# Patient Record
Sex: Male | Born: 1975
Health system: Southern US, Community
[De-identification: ages and names within clinical notes are randomized; demographics above are authoritative.]

## PROBLEM LIST (undated history)

## (undated) DIAGNOSIS — M25519 Pain in unspecified shoulder: Secondary | ICD-10-CM

## (undated) DIAGNOSIS — T7840XA Allergy, unspecified, initial encounter: Secondary | ICD-10-CM

## (undated) DIAGNOSIS — G4700 Insomnia, unspecified: Secondary | ICD-10-CM

## (undated) DIAGNOSIS — S060X9A Concussion with loss of consciousness of unspecified duration, initial encounter: Secondary | ICD-10-CM

## (undated) DIAGNOSIS — F419 Anxiety disorder, unspecified: Secondary | ICD-10-CM

## (undated) DIAGNOSIS — E559 Vitamin D deficiency, unspecified: Secondary | ICD-10-CM

## (undated) DIAGNOSIS — S060XAA Concussion with loss of consciousness status unknown, initial encounter: Secondary | ICD-10-CM

## (undated) DIAGNOSIS — B019 Varicella without complication: Secondary | ICD-10-CM

## (undated) HISTORY — DX: Vitamin D deficiency, unspecified: E55.9

## (undated) HISTORY — DX: Allergy, unspecified, initial encounter: T78.40XA

## (undated) HISTORY — DX: Concussion with loss of consciousness of unspecified duration, initial encounter: S06.0X9A

## (undated) HISTORY — DX: Anxiety disorder, unspecified: F41.9

## (undated) HISTORY — DX: Concussion with loss of consciousness status unknown, initial encounter: S06.0XAA

## (undated) HISTORY — DX: Varicella without complication: B01.9

## (undated) HISTORY — DX: Pain in unspecified shoulder: M25.519

## (undated) HISTORY — DX: Insomnia, unspecified: G47.00

---

## 2012-05-22 HISTORY — PX: VASECTOMY: SHX75

## 2013-05-22 HISTORY — PX: ROTATOR CUFF REPAIR: SHX139

## 2014-01-21 ENCOUNTER — Ambulatory Visit: Payer: Self-pay | Admitting: General Practice

## 2014-10-02 ENCOUNTER — Other Ambulatory Visit: Payer: Self-pay | Admitting: *Deleted

## 2014-10-02 ENCOUNTER — Ambulatory Visit (INDEPENDENT_AMBULATORY_CARE_PROVIDER_SITE_OTHER): Payer: 59 | Admitting: Internal Medicine

## 2014-10-02 DIAGNOSIS — Z23 Encounter for immunization: Secondary | ICD-10-CM | POA: Diagnosis not present

## 2014-10-02 DIAGNOSIS — Z9189 Other specified personal risk factors, not elsewhere classified: Secondary | ICD-10-CM

## 2014-10-02 MED ORDER — CIPROFLOXACIN HCL 500 MG PO TABS: 500.0000 mg | ORAL_TABLET | Freq: Two times a day (BID) | ORAL | Status: DC

## 2014-10-02 MED ORDER — TYPHOID VACCINE PO CPDR: 1.0000 | DELAYED_RELEASE_CAPSULE | ORAL | Status: DC

## 2014-10-02 NOTE — Progress Notes (Signed)
   Subjective:    Patient ID: Kevin ListerJoshua T [name], male    DOB: 01/18/1976, 39 y.o.   MRN: [id number]  HPI 39 yo M in good state of health going [place] [facility] 2 [place] as part of family vacation while his wife is there for work for [employer]. He has not had flu or tdap that he can recal  Prior travel = [place] and [place]  No Known Allergies    Review of Systems     Objective:   Physical Exam        Assessment & Plan:  Pre travel counseling  Pre travel vaccination = recommend hepatitis A, Tdap and will give oral typhoid vaccine  Traveler's diarrhea = gave rx for cipro to use if needed  Gave instruction for mosquite bite prevention

## 2015-07-27 ENCOUNTER — Ambulatory Visit: Payer: 59 | Admitting: Family Medicine

## 2015-08-03 ENCOUNTER — Encounter: Payer: Self-pay | Admitting: Family Medicine

## 2015-08-03 ENCOUNTER — Ambulatory Visit (INDEPENDENT_AMBULATORY_CARE_PROVIDER_SITE_OTHER): Payer: 59 | Admitting: Family Medicine

## 2015-08-03 VITALS — BP 122/88 | HR 65 | Temp 98.7°F | Ht 70.5 in | Wt 204.5 lb

## 2015-08-03 DIAGNOSIS — R6882 Decreased libido: Secondary | ICD-10-CM | POA: Insufficient documentation

## 2015-08-03 DIAGNOSIS — Z0000 Encounter for general adult medical examination without abnormal findings: Secondary | ICD-10-CM

## 2015-08-03 DIAGNOSIS — G4700 Insomnia, unspecified: Secondary | ICD-10-CM | POA: Diagnosis not present

## 2015-08-03 DIAGNOSIS — E782 Mixed hyperlipidemia: Secondary | ICD-10-CM | POA: Insufficient documentation

## 2015-08-03 DIAGNOSIS — E785 Hyperlipidemia, unspecified: Secondary | ICD-10-CM | POA: Insufficient documentation

## 2015-08-03 NOTE — Progress Notes (Signed)
v  Subjective:  Patient ID: Kevin [name], male    DOB: 06-02-1975  Age: 40 y.o. MRN: [id number]  CC: Establish care  HPI Kevin [name] is a 40 y.o. male presents to the clinic today to establish care. Additional concerns are below.  Preventative Healthcare  Colonoscopy: N/A.  Immunizations  Tetanus - up-to-date. Was given last year.  Pneumococcal - not indicated.  Flu - declined.  Zoster - not indicated.  Prostate cancer screening: Not indicated at this time given lack of family history.  Hepatitis C screening - not within screening guidelines.  Labs: Has had recent laboratory studies and 2016.  Exercise: Exercises regularly.  Alcohol use: Approximately 10 beers a week.  Smoking/tobacco use: No.  STD/HIV testing: Happily married. Not indicated time.  Regular dental exams: Yes.  Wears seat belt: Yes.  Decreased libido  Patient reports that he's had decreased libido for the past year or so.  Seems to have occurred after he took Propecia for hair loss.  No reported issues with her dysfunction.  No other associated symptoms.  No known exacerbating or relieving factors.  Insomnia  Patient reports that he's had insomnia for the past 2-3 years.  He states that it occurs approximately 2-3 times a week.  He states that he has trouble staying asleep as he awakens at approximately 1 to 2:30 in the morning. Subsequent has difficulty falling back asleep.  He denies having any difficulty falling asleep initially.  He has been taking melatonin with some improvement.  This may be exacerbated by stress.  PMH, Surgical Hx, Family Hx, Social History reviewed and updated as below.  Past Medical History  Diagnosis Date  . Allergy   . Chicken pox    Past Surgical History  Procedure Laterality Date  . Rotator cuff repair  2015  . Vasectomy  2014   Family History  Problem Relation Age of Onset  . Hypertension Mother   . Hyperlipidemia Father   .  Hyperlipidemia Maternal Grandmother   . Hypertension Maternal Grandmother   . Arthritis Maternal Grandmother    Social History  Substance Use Topics  . Smoking status: Never Smoker   . Smokeless tobacco: Never Used  . Alcohol Use: 6.0 oz/week    10 Cans of beer per week   Review of Systems  Genitourinary:       Decreased libido.  Psychiatric/Behavioral:       Insomnia.  All other systems reviewed and are negative.  Objective:   Today's Vitals: BP 122/88 mmHg  Pulse 65  Temp(Src) 98.7 F (37.1 C) (Oral)  Ht 5' 10.5" (1.791 m)  Wt 204 lb 8 oz (92.761 kg)  BMI 28.92 kg/m2  SpO2 96%  Physical Exam  Constitutional: He is oriented to person, place, and time. He appears well-developed and well-nourished. No distress.  HENT:  Head: Normocephalic and atraumatic.  Nose: Nose normal.  Mouth/Throat: Oropharynx is clear and moist. No oropharyngeal exudate.  Normal TM's bilaterally.   Eyes: Conjunctivae are normal. No scleral icterus.  Neck: Neck supple. No thyromegaly present.  Cardiovascular: Normal rate and regular rhythm.   No murmur heard. Pulmonary/Chest: Effort normal and breath sounds normal. He has no wheezes. He has no rales.  Abdominal: Soft. He exhibits no distension. There is no tenderness. There is no rebound and no guarding.  Musculoskeletal: Normal range of motion. He exhibits no edema.  Lymphadenopathy:    He has no cervical adenopathy.  Neurological: He is alert and oriented to person, place,  and time.  Skin: Skin is warm and dry. No rash noted.  Psychiatric: He has a normal mood and affect.  Vitals reviewed.  Assessment & Plan:   Problem List Items Addressed This Visit    Preventative health care - Primary    Has had labs in the past year. Tetanus immunization up-to-date. Declined influenza. Exercises regularly.       Insomnia    Discussed potential treatment options. Patient would like to continue melatonin that this time.      Decreased libido      Unclear etiology but could be secondary to adverse effect from Propecia. He has now discontinued this. Exam unremarkable. Patient will no other associated symptoms. Discussed testosterone testing. Patient will wait on this at this time.         Outpatient Encounter Prescriptions as of 08/03/2015  Medication Sig  . Creatine POWD   . Melatonin 5 MG TABS Take 3.5 mg by mouth.  . Multiple Vitamin (MULTI-VITAMINS) TABS Take by mouth.  . Omega-3 Fatty Acids (FISH OIL PO) Take by mouth.  . valACYclovir (VALTREX) 1000 MG tablet   . [DISCONTINUED] diclofenac (VOLTAREN) 75 MG EC tablet Take 75 mg by mouth 2 (two) times daily with a meal.  . [DISCONTINUED] ciprofloxacin (CIPRO) 500 MG tablet Take 1 tablet (500 mg total) by mouth 2 (two) times daily. If you have 3 loose stools in 24hr. Can stop taking if diarrhea resolves  . [DISCONTINUED] typhoid (VIVOTIF BERNA VACCINE) DR capsule Take 1 capsule by mouth every other day. As directed   No facility-administered encounter medications on file as of 08/03/2015.    Follow-up: Annually or sooner if needed.  Everlene OtherJayce Mileigh Tilley DO [facility] [place]

## 2015-08-03 NOTE — Assessment & Plan Note (Signed)
Unclear etiology but could be secondary to adverse effect from Propecia. He has now discontinued this. Exam unremarkable. Patient will no other associated symptoms. Discussed testosterone testing. Patient will wait on this at this time.

## 2015-08-03 NOTE — Assessment & Plan Note (Signed)
Has had labs in the past year. Tetanus immunization up-to-date. Declined influenza. Exercises regularly.

## 2015-08-03 NOTE — Assessment & Plan Note (Signed)
Discussed potential treatment options. Patient would like to continue melatonin that this time.

## 2015-08-03 NOTE — Progress Notes (Signed)
Pre visit review using our clinic review tool, if applicable. No additional management support is needed unless otherwise documented below in the visit note. 

## 2015-08-03 NOTE — Patient Instructions (Signed)
Follow up annually or sooner if needed.  Take care  Dr. Lanetta Figuero   Health Maintenance, Male A healthy lifestyle and preventative care can promote health and wellness.  Maintain regular health, dental, and eye exams.  Eat a healthy diet. Foods like vegetables, fruits, whole grains, low-fat dairy products, and lean protein foods contain the nutrients you need and are low in calories. Decrease your intake of foods high in solid fats, added sugars, and salt. Get information about a proper diet from your health care provider, if necessary.  Regular physical exercise is one of the most important things you can do for your health. Most adults should get at least 150 minutes of moderate-intensity exercise (any activity that increases your heart rate and causes you to sweat) each week. In addition, most adults need muscle-strengthening exercises on 2 or more days a week.   Maintain a healthy weight. The body mass index (BMI) is a screening tool to identify possible weight problems. It provides an estimate of body fat based on height and weight. Your health care provider can find your BMI and can help you achieve or maintain a healthy weight. For males 20 years and older:  A BMI below 18.5 is considered underweight.  A BMI of 18.5 to 24.9 is normal.  A BMI of 25 to 29.9 is considered overweight.  A BMI of 30 and above is considered obese.  Maintain normal blood lipids and cholesterol by exercising and minimizing your intake of saturated fat. Eat a balanced diet with plenty of fruits and vegetables. Blood tests for lipids and cholesterol should begin at age 20 and be repeated every 5 years. If your lipid or cholesterol levels are high, you are over age 50, or you are at high risk for heart disease, you may need your cholesterol levels checked more frequently.Ongoing high lipid and cholesterol levels should be treated with medicines if diet and exercise are not working.  If you smoke, find out from your  health care provider how to quit. If you do not use tobacco, do not start.  Lung cancer screening is recommended for adults aged 55-80 years who are at high risk for developing lung cancer because of a history of smoking. A yearly low-dose CT scan of the lungs is recommended for people who have at least a 30-pack-year history of smoking and are current smokers or have quit within the past 15 years. A pack year of smoking is smoking an average of 1 pack of cigarettes a day for 1 year (for example, a 30-pack-year history of smoking could mean smoking 1 pack a day for 30 years or 2 packs a day for 15 years). Yearly screening should continue until the smoker has stopped smoking for at least 15 years. Yearly screening should be stopped for people who develop a health problem that would prevent them from having lung cancer treatment.  If you choose to drink alcohol, do not have more than 2 drinks per day. One drink is considered to be 12 oz (360 mL) of beer, 5 oz (150 mL) of wine, or 1.5 oz (45 mL) of liquor.  Avoid the use of street drugs. Do not share needles with anyone. Ask for help if you need support or instructions about stopping the use of drugs.  High blood pressure causes heart disease and increases the risk of stroke. High blood pressure is more likely to develop in:  People who have blood pressure in the end of the normal range (100-139/85-89 mm   Hg).  People who are overweight or obese.  People who are African American.  If you are 18-39 years of age, have your blood pressure checked every 3-5 years. If you are 40 years of age or older, have your blood pressure checked every year. You should have your blood pressure measured twice--once when you are at a hospital or clinic, and once when you are not at a hospital or clinic. Record the average of the two measurements. To check your blood pressure when you are not at a hospital or clinic, you can use:  An automated blood pressure machine at a  pharmacy.  A home blood pressure monitor.  If you are 45-79 years old, ask your health care provider if you should take aspirin to prevent heart disease.  Diabetes screening involves taking a blood sample to check your fasting blood sugar level. This should be done once every 3 years after age 45 if you are at a normal weight and without risk factors for diabetes. Testing should be considered at a younger age or be carried out more frequently if you are overweight and have at least 1 risk factor for diabetes.  Colorectal cancer can be detected and often prevented. Most routine colorectal cancer screening begins at the age of 50 and continues through age 75. However, your health care provider may recommend screening at an earlier age if you have risk factors for colon cancer. On a yearly basis, your health care provider may provide home test kits to check for hidden blood in the stool. A small camera at the end of a tube may be used to directly examine the colon (sigmoidoscopy or colonoscopy) to detect the earliest forms of colorectal cancer. Talk to your health care provider about this at age 50 when routine screening begins. A direct exam of the colon should be repeated every 5-10 years through age 75, unless early forms of precancerous polyps or small growths are found.  People who are at an increased risk for hepatitis B should be screened for this virus. You are considered at high risk for hepatitis B if:  You were born in a country where hepatitis B occurs often. Talk with your health care provider about which countries are considered high risk.  Your parents were born in a high-risk country and you have not received a shot to protect against hepatitis B (hepatitis B vaccine).  You have HIV or AIDS.  You use needles to inject street drugs.  You live with, or have sex with, someone who has hepatitis B.  You are a man who has sex with other men (MSM).  You get hemodialysis  treatment.  You take certain medicines for conditions like cancer, organ transplantation, and autoimmune conditions.  Hepatitis C blood testing is recommended for all people born from 1945 through 1965 and any individual with known risk factors for hepatitis C.  Healthy men should no longer receive prostate-specific antigen (PSA) blood tests as part of routine cancer screening. Talk to your health care provider about prostate cancer screening.  Testicular cancer screening is not recommended for adolescents or adult males who have no symptoms. Screening includes self-exam, a health care provider exam, and other screening tests. Consult with your health care provider about any symptoms you have or any concerns you have about testicular cancer.  Practice safe sex. Use condoms and avoid high-risk sexual practices to reduce the spread of sexually transmitted infections (STIs).  You should be screened for STIs, including gonorrhea and   chlamydia if:  You are sexually active and are younger than 24 years.  You are older than 24 years, and your health care provider tells you that you are at risk for this type of infection.  Your sexual activity has changed since you were last screened, and you are at an increased risk for chlamydia or gonorrhea. Ask your health care provider if you are at risk.  If you are at risk of being infected with HIV, it is recommended that you take a prescription medicine daily to prevent HIV infection. This is called pre-exposure prophylaxis (PrEP). You are considered at risk if:  You are a man who has sex with other men (MSM).  You are a heterosexual man who is sexually active with multiple partners.  You take drugs by injection.  You are sexually active with a partner who has HIV.  Talk with your health care provider about whether you are at high risk of being infected with HIV. If you choose to begin PrEP, you should first be tested for HIV. You should then be tested  every 3 months for as long as you are taking PrEP.  Use sunscreen. Apply sunscreen liberally and repeatedly throughout the day. You should seek shade when your shadow is shorter than you. Protect yourself by wearing long sleeves, pants, a wide-brimmed hat, and sunglasses year round whenever you are outdoors.  Tell your health care provider of new moles or changes in moles, especially if there is a change in shape or color. Also, tell your health care provider if a mole is larger than the size of a pencil eraser.  A one-time screening for abdominal aortic aneurysm (AAA) and surgical repair of large AAAs by ultrasound is recommended for men aged 65-75 years who are current or former smokers.  Stay current with your vaccines (immunizations).   This information is not intended to replace advice given to you by your health care provider. Make sure you discuss any questions you have with your health care provider.   Document Released: 11/04/2007 Document Revised: 05/29/2014 Document Reviewed: 10/03/2010 Elsevier Interactive Patient Education 2016 Elsevier Inc.  

## 2015-08-12 ENCOUNTER — Encounter: Payer: Self-pay | Admitting: Family Medicine

## 2015-08-12 ENCOUNTER — Ambulatory Visit (INDEPENDENT_AMBULATORY_CARE_PROVIDER_SITE_OTHER): Payer: 59 | Admitting: Family Medicine

## 2015-08-12 VITALS — BP 120/84 | HR 81 | Temp 98.5°F | Wt 202.2 lb

## 2015-08-12 DIAGNOSIS — H8110 Benign paroxysmal vertigo, unspecified ear: Secondary | ICD-10-CM

## 2015-08-12 MED ORDER — MECLIZINE HCL 25 MG PO TABS: 25.0000 mg | ORAL_TABLET | Freq: Three times a day (TID) | ORAL | Status: DC | PRN

## 2015-08-12 NOTE — Patient Instructions (Signed)
Benign Positional Vertigo Vertigo is the feeling that you or your surroundings are moving when they are not. Benign positional vertigo is the most common form of vertigo. The cause of this condition is not serious (is benign). This condition is triggered by certain movements and positions (is positional). This condition can be dangerous if it occurs while you are doing something that could endanger you or others, such as driving.  CAUSES In many cases, the cause of this condition is not known. It may be caused by a disturbance in an area of the inner ear that helps your brain to sense movement and balance. This disturbance can be caused by a viral infection (labyrinthitis), head injury, or repetitive motion. RISK FACTORS This condition is more likely to develop in:  Women.  People who are 42 years of age or older. SYMPTOMS Symptoms of this condition usually happen when you move your head or your eyes in different directions. Symptoms may start suddenly, and they usually last for less than a minute. Symptoms may include:  Loss of balance and falling.  Feeling like you are spinning or moving.  Feeling like your surroundings are spinning or moving.  Nausea and vomiting.  Blurred vision.  Dizziness.  Involuntary eye movement (nystagmus). Symptoms can be mild and cause only slight annoyance, or they can be severe and interfere with daily life. Episodes of benign positional vertigo may return (recur) over time, and they may be triggered by certain movements. Symptoms may improve over time. DIAGNOSIS This condition is usually diagnosed by medical history and a physical exam of the head, neck, and ears. You may be referred to a health care provider who specializes in ear, nose, and throat (ENT) problems (otolaryngologist) or a provider who specializes in disorders of the nervous system (neurologist). You may have additional testing, including:  MRI.  A CT scan.  Eye movement tests. Your  health care provider may ask you to change positions quickly while he or she watches you for symptoms of benign positional vertigo, such as nystagmus. Eye movement may be tested with an electronystagmogram (ENG), caloric stimulation, the Dix-Hallpike test, or the roll test.  An electroencephalogram (EEG). This records electrical activity in your brain.  Hearing tests. TREATMENT Usually, your health care provider will treat this by moving your head in specific positions to adjust your inner ear back to normal. Surgery may be needed in severe cases, but this is rare. In some cases, benign positional vertigo may resolve on its own in 2-4 weeks. HOME CARE INSTRUCTIONS Safety  Move slowly.Avoid sudden body or head movements.  Avoid driving.  Avoid operating heavy machinery.  Avoid doing any tasks that would be dangerous to you or others if a vertigo episode would occur.  If you have trouble walking or keeping your balance, try using a cane for stability. If you feel dizzy or unstable, sit down right away.  Return to your normal activities as told by your health care provider. Ask your health care provider what activities are safe for you. General Instructions  Take over-the-counter and prescription medicines only as told by your health care provider.  Avoid certain positions or movements as told by your health care provider.  Drink enough fluid to keep your urine clear or pale yellow.  Keep all follow-up visits as told by your health care provider. This is important. SEEK MEDICAL CARE IF:  You have a fever.  Your condition gets worse or you develop new symptoms.  Your family or friends   notice any behavioral changes.  Your nausea or vomiting gets worse.  You have numbness or a "pins and needles" sensation. SEEK IMMEDIATE MEDICAL CARE IF:  You have difficulty speaking or moving.  You are always dizzy.  You faint.  You develop severe headaches.  You have weakness in your  legs or arms.  You have changes in your hearing or vision.  You develop a stiff neck.  You develop sensitivity to light.   This information is not intended to replace advice given to you by your health care provider. Make sure you discuss any questions you have with your health care provider.   Document Released: 02/13/2006 Document Revised: 01/27/2015 Document Reviewed: 08/31/2014 Elsevier Interactive Patient Education 2016 Elsevier Inc.  

## 2015-08-12 NOTE — Progress Notes (Signed)
Pre visit review using our clinic review tool, if applicable. No additional management support is needed unless otherwise documented below in the visit note. 

## 2015-08-13 DIAGNOSIS — H8110 Benign paroxysmal vertigo, unspecified ear: Secondary | ICD-10-CM | POA: Insufficient documentation

## 2015-08-13 NOTE — Progress Notes (Signed)
   Subjective:  Patient ID: Barnabas ListerJoshua T Housel, male    DOB: 05/11/1976  Age: 40 y.o. MRN: [id number]  CC: Vertigo  HPI:  40 year old male presents to clinic today with complaints of vertigo.  Vertigo  Started yesterday.  Describes it as feeling unsteady on his feet.  Occurs intermittently.  Lasts minutes then self resolves.  Took Valium last night; has not noticed effect.  He states that it is worse with cough.  No known relieving factors.   Possible inciting factor: recent cold.  Social Hx   Social History   Social History  . Marital Status: Single    Spouse Name: N/A  . Number of Children: N/A  . Years of Education: N/A   Social History Main Topics  . Smoking status: Never Smoker   . Smokeless tobacco: Never Used  . Alcohol Use: 6.0 oz/week    10 Cans of beer per week  . Drug Use: Yes     Comment: acid- teenager  . Sexual Activity: Yes    Birth Control/ Protection: None   Other Topics Concern  . None   Social History Narrative   Review of Systems  Constitutional: Negative.   Neurological: Positive for dizziness.   Objective:  BP 120/84 mmHg  Pulse 81  Temp(Src) 98.5 F (36.9 C) (Oral)  Wt 202 lb 4 oz (91.74 kg)  SpO2 97%  BP/Weight 08/12/2015 08/03/2015  Systolic BP 120 122  Diastolic BP 84 88  Wt. (Lbs) 202.25 204.5  BMI 28.6 28.92   Physical Exam  Constitutional: He is oriented to person, place, and time. He appears well-developed. No distress.  HENT:  Head: Normocephalic and atraumatic.  Nystagmus noted with lateral gaze (left).  Cardiovascular: Normal rate and regular rhythm.   Pulmonary/Chest: Effort normal and breath sounds normal.  Neurological: He is alert and oriented to person, place, and time.  Nystagmus noted with lateral gaze (left). No focal neurological deficits.  Vitals reviewed.  Assessment & Plan:   Problem List Items Addressed This Visit    BPPV (benign paroxysmal positional vertigo) - Primary    New  problem. Treating with meclizine.         Meds ordered this encounter  Medications  . meclizine (ANTIVERT) 25 MG tablet    Sig: Take 1 tablet (25 mg total) by mouth 3 (three) times daily as needed for dizziness.    Dispense:  30 tablet    Refill:  0   Follow-up: PRN  Everlene OtherJayce Kayda Allers DO [facility] [place]

## 2015-08-13 NOTE — Assessment & Plan Note (Signed)
New problem. Treating with meclizine. 

## 2015-12-14 ENCOUNTER — Telehealth: Payer: Self-pay | Admitting: Family Medicine

## 2015-12-14 NOTE — Telephone Encounter (Signed)
Patient has appt on 8/7, please advise for labs prior including Testosterone.  No labs on file for [place]. thanks

## 2015-12-14 NOTE — Telephone Encounter (Signed)
Pt called wanting to get fasting labs with lab checking his testosterone before his physical appt.  Need labs please and thank you.  Call pt @ [phone].

## 2015-12-16 ENCOUNTER — Other Ambulatory Visit: Payer: Self-pay | Admitting: Family Medicine

## 2015-12-16 DIAGNOSIS — Z130 Encounter for screening for diseases of the blood and blood-forming organs and certain disorders involving the immune mechanism: Secondary | ICD-10-CM

## 2015-12-16 DIAGNOSIS — R6882 Decreased libido: Secondary | ICD-10-CM

## 2015-12-16 DIAGNOSIS — E663 Overweight: Secondary | ICD-10-CM

## 2015-12-22 NOTE — Telephone Encounter (Signed)
Patient has requested to have a update on having labs prior to appt , if needed

## 2015-12-22 NOTE — Telephone Encounter (Signed)
Ok. I scheduled lab appt for 08/03 @ 8:30am and left a vm. Thank you!

## 2015-12-22 NOTE — Telephone Encounter (Signed)
Orders are in. He can set up appt for draw.

## 2015-12-22 NOTE — Telephone Encounter (Signed)
Please advise on below, thanks

## 2015-12-22 NOTE — Telephone Encounter (Signed)
Can schedule for labs thanks

## 2015-12-23 ENCOUNTER — Other Ambulatory Visit (INDEPENDENT_AMBULATORY_CARE_PROVIDER_SITE_OTHER): Payer: 59

## 2015-12-23 DIAGNOSIS — E663 Overweight: Secondary | ICD-10-CM

## 2015-12-23 DIAGNOSIS — Z130 Encounter for screening for diseases of the blood and blood-forming organs and certain disorders involving the immune mechanism: Secondary | ICD-10-CM

## 2015-12-23 DIAGNOSIS — R6882 Decreased libido: Secondary | ICD-10-CM | POA: Diagnosis not present

## 2015-12-23 LAB — LIPID PANEL
Cholesterol: 210 mg/dL — ABNORMAL HIGH (ref 0–200)
HDL: 36.9 mg/dL — ABNORMAL LOW (ref >39.00)
NONHDL: 172.66
TRIGLYCERIDES: 312 mg/dL — AB (ref 0.0–149.0)
Total CHOL/HDL Ratio: 6
VLDL: 62.4 mg/dL — ABNORMAL HIGH (ref 0.0–40.0)

## 2015-12-23 LAB — COMPREHENSIVE METABOLIC PANEL
ALBUMIN: 4.5 g/dL (ref 3.5–5.2)
ALT: 51 U/L (ref 0–53)
AST: 28 U/L (ref 0–37)
Alkaline Phosphatase: 77 U/L (ref 39–117)
BILIRUBIN TOTAL: 0.5 mg/dL (ref 0.2–1.2)
BUN: 14 mg/dL (ref 6–23)
CALCIUM: 9.2 mg/dL (ref 8.4–10.5)
CO2: 29 meq/L (ref 19–32)
CREATININE: 1.06 mg/dL (ref 0.40–1.50)
Chloride: 103 mEq/L (ref 96–112)
GFR: 81.37 mL/min (ref >60.00)
Glucose, Bld: 96 mg/dL (ref 70–99)
Potassium: 4.3 mEq/L (ref 3.5–5.1)
Sodium: 138 mEq/L (ref 135–145)
Total Protein: 7.4 g/dL (ref 6.0–8.3)

## 2015-12-23 LAB — CBC
HCT: 45 % (ref 39.0–52.0)
Hemoglobin: 15.7 g/dL (ref 13.0–17.0)
MCHC: 34.9 g/dL (ref 30.0–36.0)
MCV: 94.3 fl (ref 78.0–100.0)
PLATELETS: 229 10*3/uL (ref 150.0–400.0)
RBC: 4.78 Mil/uL (ref 4.22–5.81)
RDW: 12.2 % (ref 11.5–15.5)
WBC: 5.6 10*3/uL (ref 4.0–10.5)

## 2015-12-23 LAB — HEMOGLOBIN A1C: HEMOGLOBIN A1C: 5.2 % (ref 4.6–6.5)

## 2015-12-23 LAB — LDL CHOLESTEROL, DIRECT: Direct LDL: 121 mg/dL

## 2015-12-24 LAB — TESTOSTERONE TOTAL,FREE,BIO, MALES
Albumin: 4.6 g/dL (ref 3.6–5.1)
SEX HORMONE BINDING: 24 nmol/L (ref 10–50)
TESTOSTERONE: 461 ng/dL (ref 250–827)
Testosterone, Bioavailable: 164.8 ng/dL (ref 130.5–681.7)
Testosterone, Free: 78.5 pg/mL (ref 47.0–244.0)

## 2015-12-27 ENCOUNTER — Ambulatory Visit (INDEPENDENT_AMBULATORY_CARE_PROVIDER_SITE_OTHER): Payer: 59 | Admitting: Family Medicine

## 2015-12-27 ENCOUNTER — Encounter: Payer: Self-pay | Admitting: Family Medicine

## 2015-12-27 DIAGNOSIS — E785 Hyperlipidemia, unspecified: Secondary | ICD-10-CM | POA: Diagnosis not present

## 2015-12-27 DIAGNOSIS — R5382 Chronic fatigue, unspecified: Secondary | ICD-10-CM | POA: Diagnosis not present

## 2015-12-27 DIAGNOSIS — R5383 Other fatigue: Secondary | ICD-10-CM | POA: Insufficient documentation

## 2015-12-27 NOTE — Patient Instructions (Signed)
Try and exercise regularly.  If it persists let me know.  Take care  Dr. Adriana Simasook  Health Maintenance, Male A healthy lifestyle and preventative care can promote health and wellness.  Maintain regular health, dental, and eye exams.  Eat a healthy diet. Foods like vegetables, fruits, whole grains, low-fat dairy products, and lean protein foods contain the nutrients you need and are low in calories. Decrease your intake of foods high in solid fats, added sugars, and salt. Get information about a proper diet from your health care provider, if necessary.  Regular physical exercise is one of the most important things you can do for your health. Most adults should get at least 150 minutes of moderate-intensity exercise (any activity that increases your heart rate and causes you to sweat) each week. In addition, most adults need muscle-strengthening exercises on 2 or more days a week.   Maintain a healthy weight. The body mass index (BMI) is a screening tool to identify possible weight problems. It provides an estimate of body fat based on height and weight. Your health care provider can find your BMI and can help you achieve or maintain a healthy weight. For males 20 years and older:  A BMI below 18.5 is considered underweight.  A BMI of 18.5 to 24.9 is normal.  A BMI of 25 to 29.9 is considered overweight.  A BMI of 30 and above is considered obese.  Maintain normal blood lipids and cholesterol by exercising and minimizing your intake of saturated fat. Eat a balanced diet with plenty of fruits and vegetables. Blood tests for lipids and cholesterol should begin at age 42 and be repeated every 5 years. If your lipid or cholesterol levels are high, you are over age 42, or you are at high risk for heart disease, you may need your cholesterol levels checked more frequently.Ongoing high lipid and cholesterol levels should be treated with medicines if diet and exercise are not working.  If you smoke,  find out from your health care provider how to quit. If you do not use tobacco, do not start.  Lung cancer screening is recommended for adults aged 55-80 years who are at high risk for developing lung cancer because of a history of smoking. A yearly low-dose CT scan of the lungs is recommended for people who have at least a 30-pack-year history of smoking and are current smokers or have quit within the past 15 years. A pack year of smoking is smoking an average of 1 pack of cigarettes a day for 1 year (for example, a 30-pack-year history of smoking could mean smoking 1 pack a day for 30 years or 2 packs a day for 15 years). Yearly screening should continue until the smoker has stopped smoking for at least 15 years. Yearly screening should be stopped for people who develop a health problem that would prevent them from having lung cancer treatment.  If you choose to drink alcohol, do not have more than 2 drinks per day. One drink is considered to be 12 oz (360 mL) of beer, 5 oz (150 mL) of wine, or 1.5 oz (45 mL) of liquor.  Avoid the use of street drugs. Do not share needles with anyone. Ask for help if you need support or instructions about stopping the use of drugs.  High blood pressure causes heart disease and increases the risk of stroke. High blood pressure is more likely to develop in:  People who have blood pressure in the end of the normal  range (100-139/85-89 mm Hg).  People who are overweight or obese.  People who are African American.  If you are 10-13 years of age, have your blood pressure checked every 3-5 years. If you are 1 years of age or older, have your blood pressure checked every year. You should have your blood pressure measured twice--once when you are at a hospital or clinic, and once when you are not at a hospital or clinic. Record the average of the two measurements. To check your blood pressure when you are not at a hospital or clinic, you can use:  An automated blood  pressure machine at a pharmacy.  A home blood pressure monitor.  If you are 47-67 years old, ask your health care provider if you should take aspirin to prevent heart disease.  Diabetes screening involves taking a blood sample to check your fasting blood sugar level. This should be done once every 3 years after age 70 if you are at a normal weight and without risk factors for diabetes. Testing should be considered at a younger age or be carried out more frequently if you are overweight and have at least 1 risk factor for diabetes.  Colorectal cancer can be detected and often prevented. Most routine colorectal cancer screening begins at the age of 64 and continues through age 96. However, your health care provider may recommend screening at an earlier age if you have risk factors for colon cancer. On a yearly basis, your health care provider may provide home test kits to check for hidden blood in the stool. A small camera at the end of a tube may be used to directly examine the colon (sigmoidoscopy or colonoscopy) to detect the earliest forms of colorectal cancer. Talk to your health care provider about this at age 35 when routine screening begins. A direct exam of the colon should be repeated every 5-10 years through age 58, unless early forms of precancerous polyps or small growths are found.  People who are at an increased risk for hepatitis B should be screened for this virus. You are considered at high risk for hepatitis B if:  You were born in a country where hepatitis B occurs often. Talk with your health care provider about which countries are considered high risk.  Your parents were born in a high-risk country and you have not received a shot to protect against hepatitis B (hepatitis B vaccine).  You have HIV or AIDS.  You use needles to inject street drugs.  You live with, or have sex with, someone who has hepatitis B.  You are a man who has sex with other men (MSM).  You get  hemodialysis treatment.  You take certain medicines for conditions like cancer, organ transplantation, and autoimmune conditions.  Hepatitis C blood testing is recommended for all people born from [birth year] through 1967 and any individual with known risk factors for hepatitis C.  Healthy men should no longer receive prostate-specific antigen (PSA) blood tests as part of routine cancer screening. Talk to your health care provider about prostate cancer screening.  Testicular cancer screening is not recommended for adolescents or adult males who have no symptoms. Screening includes self-exam, a health care provider exam, and other screening tests. Consult with your health care provider about any symptoms you have or any concerns you have about testicular cancer.  Practice safe sex. Use condoms and avoid high-risk sexual practices to reduce the spread of sexually transmitted infections (STIs).  You should be screened for STIs,  including gonorrhea and chlamydia if:  You are sexually active and are younger than 24 years.  You are older than 24 years, and your health care provider tells you that you are at risk for this type of infection.  Your sexual activity has changed since you were last screened, and you are at an increased risk for chlamydia or gonorrhea. Ask your health care provider if you are at risk.  If you are at risk of being infected with HIV, it is recommended that you take a prescription medicine daily to prevent HIV infection. This is called pre-exposure prophylaxis (PrEP). You are considered at risk if:  You are a man who has sex with other men (MSM).  You are a heterosexual man who is sexually active with multiple partners.  You take drugs by injection.  You are sexually active with a partner who has HIV.  Talk with your health care provider about whether you are at high risk of being infected with HIV. If you choose to begin PrEP, you should first be tested for HIV. You should  then be tested every 3 months for as long as you are taking PrEP.  Use sunscreen. Apply sunscreen liberally and repeatedly throughout the day. You should seek shade when your shadow is shorter than you. Protect yourself by wearing long sleeves, pants, a wide-brimmed hat, and sunglasses year round whenever you are outdoors.  Tell your health care provider of new moles or changes in moles, especially if there is a change in shape or color. Also, tell your health care provider if a mole is larger than the size of a pencil eraser.  A one-time screening for abdominal aortic aneurysm (AAA) and surgical repair of large AAAs by ultrasound is recommended for men aged 51-75 years who are current or former smokers.  Stay current with your vaccines (immunizations).   This information is not intended to replace advice given to you by your health care provider. Make sure you discuss any questions you have with your health care provider.   Document Released: 11/04/2007 Document Revised: 05/29/2014 Document Reviewed: 10/03/2010 Elsevier Interactive Patient Education [employer].

## 2015-12-27 NOTE — Progress Notes (Signed)
Subjective:  Patient ID: Kevin ListerJoshua T [name], male    DOB: 01/25/1976  Age: 40 y.o. MRN: [id number]  CC: Fatigue, review blood work  HPI Kevin [name] is a 40 y.o. male presents to the clinic today to review his blood work and discuss fatigue.  Fatigue  Patient reports that he's been experiencing fatigue.  Patient states that this is been going on for quite some time.  Patient states that he feels like he has little energy.  He states that he often feels exhausted.  He does have a stressful job.  No reports of depression but does report significant stress.  Denies snoring. States that he sleeps from 10 PM to 6 AM.  He has previous had difficulty with sleep but over the past month he has been sleeping well.  Patient is also reported decreased libido.  His testosterone studies were negative.  He is no longer exercising regularly.  He has gained 10 pounds over the past year per his report.  Hyperlipidemia  Lab studies reviewed today.  LDL was 121.  Remainder of his labs are unremarkable.  PMH, Surgical Hx, Family Hx, Social History reviewed and updated as below.  Past Medical History:  Diagnosis Date  . Allergy   . Chicken pox    Past Surgical History:  Procedure Laterality Date  . ROTATOR CUFF REPAIR  2015  . VASECTOMY  2014   Family History  Problem Relation Age of Onset  . Hypertension Mother   . Hyperlipidemia Father   . Hyperlipidemia Maternal Grandmother   . Hypertension Maternal Grandmother   . Arthritis Maternal Grandmother    Social History  Substance Use Topics  . Smoking status: Never Smoker  . Smokeless tobacco: Never Used  . Alcohol use 6.0 oz/week    10 Cans of beer per week   Review of Systems  Constitutional: Positive for fatigue.  Eyes: Positive for visual disturbance.  Neurological: Positive for dizziness.  All other systems reviewed and are negative.  Objective:   Today's Vitals: BP 122/86 (BP Location: Left Arm, Patient  Position: Sitting, Cuff Size: Large)   Pulse 61   Temp 98.3 F (36.8 C) (Oral)   Ht 5' 10.25" (1.784 m)   Wt 209 lb 2 oz (94.9 kg)   SpO2 95%   BMI 29.79 kg/m   Physical Exam  Constitutional: He is oriented to person, place, and time. He appears well-developed and well-nourished. No distress.  HENT:  Head: Normocephalic and atraumatic.  Eyes: Conjunctivae are normal. No scleral icterus.  Neck: Neck supple.  Cardiovascular: Normal rate and regular rhythm.   No murmur heard. Pulmonary/Chest: Effort normal and breath sounds normal. He has no wheezes. He has no rales.  Abdominal: Soft. He exhibits no distension. There is no tenderness. There is no rebound and no guarding.  Musculoskeletal: Normal range of motion. He exhibits no edema.  Neurological: He is alert and oriented to person, place, and time.  Skin: Skin is warm and dry. No rash noted.  Psychiatric: He has a normal mood and affect.  Vitals reviewed.  Assessment & Plan:   Problem List Items Addressed This Visit    Fatigue    New problem. Unclear etiology and prognosis. DDx: Depression/Anxiety, Insomnia, OSA, Hypothyroidism.  We discussed depression and anxiety and he feels like this is contributing. Insomnia improved at this time. I advised improved diet and regular exercise. If persists will proceed with further testing - thyroid studies, sleep study.      HLD (  hyperlipidemia)    ASCVD risk score is low given age. Advised diet and regular exercise. Patient wants to proceed with fish oil.       Other Visit Diagnoses   None.     Outpatient Encounter Prescriptions as of 12/27/2015  Medication Sig  . Creatine POWD   . meclizine (ANTIVERT) 25 MG tablet Take 1 tablet (25 mg total) by mouth 3 (three) times daily as needed for dizziness.  . Melatonin 5 MG TABS Take 3.5 mg by mouth.  . Multiple Vitamin (MULTI-VITAMINS) TABS Take by mouth.  . Omega-3 Fatty Acids (FISH OIL PO) Take by mouth.  . valACYclovir (VALTREX)  1000 MG tablet    No facility-administered encounter medications on file as of 12/27/2015.    Follow-up: PRN  Everlene Other DO [facility] [place]

## 2015-12-27 NOTE — Assessment & Plan Note (Signed)
New problem. Unclear etiology and prognosis. DDx: Depression/Anxiety, Insomnia, OSA, Hypothyroidism.  We discussed depression and anxiety and he feels like this is contributing. Insomnia improved at this time. I advised improved diet and regular exercise. If persists will proceed with further testing - thyroid studies, sleep study.

## 2015-12-27 NOTE — Assessment & Plan Note (Signed)
ASCVD risk score is low given age. Advised diet and regular exercise. Patient wants to proceed with fish oil.

## 2015-12-27 NOTE — Progress Notes (Signed)
Pre visit review using our clinic review tool, if applicable. No additional management support is needed unless otherwise documented below in the visit note. 

## 2016-03-22 ENCOUNTER — Encounter: Payer: Self-pay | Admitting: Family Medicine

## 2016-03-22 ENCOUNTER — Ambulatory Visit (INDEPENDENT_AMBULATORY_CARE_PROVIDER_SITE_OTHER): Payer: 59 | Admitting: Family Medicine

## 2016-03-22 DIAGNOSIS — F329 Major depressive disorder, single episode, unspecified: Secondary | ICD-10-CM | POA: Insufficient documentation

## 2016-03-22 DIAGNOSIS — F32A Depression, unspecified: Secondary | ICD-10-CM | POA: Insufficient documentation

## 2016-03-22 MED ORDER — BUPROPION HCL ER (XL) 150 MG PO TB24: 150.0000 mg | ORAL_TABLET | Freq: Every day | ORAL | 0 refills | Status: DC

## 2016-03-22 NOTE — Progress Notes (Signed)
Pre visit review using our clinic review tool, if applicable. No additional management support is needed unless otherwise documented below in the visit note. 

## 2016-03-22 NOTE — Assessment & Plan Note (Signed)
New problem. Given patient's history, it appears that patient is experiencing depression and that is the culprit for his fatigue and insomnia. Lengthy discussion today about his symptoms and how they are related to depression. Trial of Wellbutrin. Follow-up in 6 weeks.

## 2016-03-22 NOTE — Progress Notes (Signed)
Subjective:  Patient ID: Kevin [name], male    DOB: 07/18/1975  Age: 40 y.o. MRN: [id number]  CC: Fatigue, Insomnia  HPI:  40 year old male presents with the above concerns (these are ongoing issues).  Fatigue  Chronic, worsening over the past several months.  Reports that he feels "down".  He states that he is short-tempered/angry often. Irritable. Bothered by "little things".  Just doesn't feel like himself.  Has difficulty sleeping (see below).   Also reporting decreased interest in hobbies/activities.  Stressors at work.  Has attempted to change things at work but has had no improvement.  Insomnia  Chronic ongoing issue.  He reports that he's having difficulty staying asleep.  He sleeps a few hours and then wakes up and has difficulty returning back to sleep.  He notes stressors at work.  Social Hx   Social History   Social History  . Marital status: Single    Spouse name: N/A  . Number of children: N/A  . Years of education: N/A   Social History Main Topics  . Smoking status: Never Smoker  . Smokeless tobacco: Never Used  . Alcohol use 6.0 oz/week    10 Cans of beer per week  . Drug use:      Comment: acid- teenager  . Sexual activity: Yes    Birth control/ protection: None   Other Topics Concern  . None   Social History Narrative  . None    Review of Systems  Constitutional: Positive for fatigue.  Psychiatric/Behavioral: Positive for sleep disturbance.   Objective:  BP (!) 128/98 (BP Location: Right Arm, Patient Position: Sitting, Cuff Size: Normal)   Pulse 78   Temp 98.4 F (36.9 C) (Oral)   Resp 16   Wt 214 lb 5 oz (97.2 kg)   SpO2 96%   BMI 30.53 kg/m   BP/Weight 03/22/2016 12/27/2015 08/12/2015  Systolic BP 128 122 120  Diastolic BP 98 86 84  Wt. (Lbs) 214.31 209.13 202.25  BMI 30.53 29.79 28.6    Physical Exam  Constitutional: He is oriented to person, place, and time. He appears well-developed. No distress.  HENT:    Head: Normocephalic and atraumatic.  Eyes: Conjunctivae are normal.  Pulmonary/Chest: Effort normal.  Neurological: He is alert and oriented to person, place, and time.  Psychiatric: He has a normal mood and affect.  Vitals reviewed.  Lab Results  Component Value Date   WBC 5.6 12/23/2015   HGB 15.7 12/23/2015   HCT 45.0 12/23/2015   PLT 229.0 12/23/2015   GLUCOSE 96 12/23/2015   CHOL 210 (H) 12/23/2015   TRIG 312.0 (H) 12/23/2015   HDL 36.90 (L) 12/23/2015   LDLDIRECT 121.0 12/23/2015   ALT 51 12/23/2015   AST 28 12/23/2015   NA 138 12/23/2015   K 4.3 12/23/2015   CL 103 12/23/2015   CREATININE 1.06 12/23/2015   BUN 14 12/23/2015   CO2 29 12/23/2015   HGBA1C 5.2 12/23/2015   PHQ-9 - 9.  Assessment & Plan:   Problem List Items Addressed This Visit    Depression    New problem. Given patient's history, it appears that patient is experiencing depression and that is the culprit for his fatigue and insomnia. Lengthy discussion today about his symptoms and how they are related to depression. Trial of Wellbutrin. Follow-up in 6 weeks.      Relevant Medications   buPROPion (WELLBUTRIN XL) 150 MG 24 hr tablet    Other Visit Diagnoses  None.    Follow-up: Return in about 6 weeks (around 05/03/2016).  Everlene OtherJayce Charnel Giles DO [facility] [place]

## 2016-03-22 NOTE — Patient Instructions (Signed)
Take the Wellbutrin daily.  After 1 week increase to 300 mg (2 tablets) daily.  Follow up in 6 weeks.   Take care  Dr. Adriana Simasook

## 2016-05-03 ENCOUNTER — Encounter: Payer: Self-pay | Admitting: Family Medicine

## 2016-05-03 ENCOUNTER — Ambulatory Visit (INDEPENDENT_AMBULATORY_CARE_PROVIDER_SITE_OTHER): Payer: 59 | Admitting: Family Medicine

## 2016-05-03 VITALS — BP 111/72 | HR 77 | Temp 98.8°F | Resp 14 | Wt 214.0 lb

## 2016-05-03 DIAGNOSIS — F329 Major depressive disorder, single episode, unspecified: Secondary | ICD-10-CM

## 2016-05-03 DIAGNOSIS — H8109 Meniere's disease, unspecified ear: Secondary | ICD-10-CM | POA: Diagnosis not present

## 2016-05-03 DIAGNOSIS — F32A Depression, unspecified: Secondary | ICD-10-CM

## 2016-05-03 MED ORDER — BUPROPION HCL ER (XL) 300 MG PO TB24
300.0000 mg | ORAL_TABLET | Freq: Every day | ORAL | 1 refills | Status: DC
Start: 2016-05-03 — End: 2016-09-05

## 2016-05-03 NOTE — Assessment & Plan Note (Addendum)
New diagnosis. No improvement with Ativan and acetazolamide.  I urged him to discuss this with the specialist that he saw. May consider switching diuretic.

## 2016-05-03 NOTE — Progress Notes (Signed)
Pre visit review using our clinic review tool, if applicable. No additional management support is needed unless otherwise documented below in the visit note. 

## 2016-05-03 NOTE — Progress Notes (Addendum)
Subjective:  Patient ID: Kevin [name], male    DOB: 03/28/1976  Age: 40 y.o. MRN: [id number]  CC: Follow up depression, recent diagnosis of Mnire's  HPI:  40 year old male presents for follow-up.  Depression  He states that he seems to be slightly improved.  His wife feels like he's not been as short tempered.  Compliant with Wellbutrin. No reported side effects.  Mnire's  [place]  He's had ongoing dizziness/vertigo.  He had testing done and was told that he had Mnire's.  He is currently taking Ativan and acetazolamide.  He states that he's had little improvement.  Social Hx   Social History   Social History  . Marital status: Single    Spouse name: N/A  . Number of children: N/A  . Years of education: N/A   Social History Main Topics  . Smoking status: Never Smoker  . Smokeless tobacco: Never Used  . Alcohol use 6.0 oz/week    10 Cans of beer per week  . Drug use:      Comment: acid- teenager  . Sexual activity: Yes    Birth control/ protection: None   Other Topics Concern  . None   Social History Narrative  . None    Review of Systems  Constitutional: Negative.   Neurological: Positive for dizziness.   Objective:  BP 111/72 (BP Location: Left Arm, Patient Position: Sitting, Cuff Size: Large)   Pulse 77   Temp 98.8 F (37.1 C) (Oral)   Resp 14   Wt 214 lb (97.1 kg)   SpO2 96%   BMI 30.49 kg/m   BP/Weight 05/03/2016 03/22/2016 12/27/2015  Systolic BP 111 128 122  Diastolic BP 72 98 86  Wt. (Lbs) 214 214.31 209.13  BMI 30.49 30.53 29.79   Physical Exam  Constitutional: He is oriented to person, place, and time. He appears well-developed. No distress.  Cardiovascular: Normal rate and regular rhythm.   Pulmonary/Chest: Effort normal and breath sounds normal.  Neurological: He is alert and oriented to person, place, and time.  Psychiatric: He has a normal mood and affect.  Vitals  reviewed.  Lab Results  Component Value Date   WBC 5.6 12/23/2015   HGB 15.7 12/23/2015   HCT 45.0 12/23/2015   PLT 229.0 12/23/2015   GLUCOSE 96 12/23/2015   CHOL 210 (H) 12/23/2015   TRIG 312.0 (H) 12/23/2015   HDL 36.90 (L) 12/23/2015   LDLDIRECT 121.0 12/23/2015   ALT 51 12/23/2015   AST 28 12/23/2015   NA 138 12/23/2015   K 4.3 12/23/2015   CL 103 12/23/2015   CREATININE 1.06 12/23/2015   BUN 14 12/23/2015   CO2 29 12/23/2015   HGBA1C 5.2 12/23/2015    Assessment & Plan:   Problem List Items Addressed This Visit    Meniere disease    New diagnosis. No improvement with Ativan and acetazolamide.  I urged him to discuss this with the specialist that he saw. May consider switching diuretic.       Depression - Primary    Established problem, improving. Continue Wellbutrin. Rx sent today.      Relevant Medications   LORazepam (ATIVAN) 0.5 MG tablet   buPROPion (WELLBUTRIN XL) 300 MG 24 hr tablet     Meds ordered this encounter  Medications  . acetaZOLAMIDE (DIAMOX) 250 MG tablet  . LORazepam (ATIVAN) 0.5 MG tablet  . buPROPion (WELLBUTRIN XL) 300 MG 24 hr tablet    Sig: Take 1  tablet (300 mg total) by mouth daily.    Dispense:  90 tablet    Refill:  1    Follow-up: Annually.  Everlene OtherJayce Jaeger Trueheart DO [facility] [place]

## 2016-05-03 NOTE — Patient Instructions (Signed)
Continue your meds.  Follow up annually or sooner if needed.  Take care  Dr. Adriana Simasook

## 2016-05-03 NOTE — Assessment & Plan Note (Addendum)
Established problem, improving. Continue Wellbutrin. Rx sent today.

## 2016-05-24 ENCOUNTER — Encounter: Payer: Self-pay | Admitting: Family Medicine

## 2016-05-24 ENCOUNTER — Ambulatory Visit (INDEPENDENT_AMBULATORY_CARE_PROVIDER_SITE_OTHER): Payer: 59 | Admitting: Family Medicine

## 2016-05-24 VITALS — BP 148/100 | HR 88 | Temp 98.6°F | Resp 12 | Wt 214.4 lb

## 2016-05-24 DIAGNOSIS — R350 Frequency of micturition: Secondary | ICD-10-CM | POA: Diagnosis not present

## 2016-05-24 LAB — POCT URINALYSIS DIPSTICK
BILIRUBIN UA: NEGATIVE
Blood, UA: NEGATIVE
Glucose, UA: NEGATIVE
KETONES UA: NEGATIVE
LEUKOCYTES UA: NEGATIVE
Nitrite, UA: NEGATIVE
PH UA: 7.5
PROTEIN UA: NEGATIVE
SPEC GRAV UA: 1.01
Urobilinogen, UA: 0.2

## 2016-05-24 NOTE — Patient Instructions (Signed)
Stop the diuretic.  Let me know how your symptoms are in 2 weeks.  Take care  Dr. Adriana Simasook

## 2016-05-24 NOTE — Progress Notes (Signed)
Subjective:  Patient ID: Kevin [name], male    DOB: 04/02/1976  Age: 41 y.o. MRN: [id number]  CC: Urinary frequency/urgency  HPI:  41 year old male presents for an acute visit with the above complaint.  Patient reports he's had urinary frequency and urgency since starting diamox for Mnire's disease. Was more pronounced and evident during [place] He states that he's had to be very frequently and had a significant urge to urinate. When he uses restroom he produces little urine. No associated dysuria. No known relieving factors. No associated abdominal pain. No flank pain. He denies  nocturia. No reports of hematuria. No other associated symptoms. No other complaints at this time.  Social Hx   Social History   Social History  . Marital status: Single    Spouse name: N/A  . Number of children: N/A  . Years of education: N/A   Social History Main Topics  . Smoking status: Never Smoker  . Smokeless tobacco: Never Used  . Alcohol use 6.0 oz/week    10 Cans of beer per week  . Drug use:      Comment: acid- teenager  . Sexual activity: Yes    Birth control/ protection: None   Other Topics Concern  . None   Social History Narrative  . None    Review of Systems  Constitutional: Negative.   Gastrointestinal: Negative.   Genitourinary: Positive for frequency and urgency.   Objective:  BP (!) 148/100   Pulse 88   Temp 98.6 F (37 C) (Oral)   Resp 12   Wt 214 lb 6.4 oz (97.3 kg)   SpO2 99%   BMI 30.54 kg/m   BP/Weight 05/24/2016 05/03/2016 03/22/2016  Systolic BP 148 111 128  Diastolic BP 100 72 98  Wt. (Lbs) 214.4 214 214.31  BMI 30.54 30.49 30.53   Physical Exam  Constitutional: He is oriented to person, place, and time. He appears well-developed. No distress.  Cardiovascular: Normal rate and regular rhythm.   Pulmonary/Chest: Effort normal and breath sounds normal. He has no wheezes. He has no rales.  Abdominal: Soft. He exhibits  no distension. There is no tenderness. There is no rebound and no guarding.  Neurological: He is alert and oriented to person, place, and time.  Psychiatric: He has a normal mood and affect.  Vitals reviewed.  Results for orders placed or performed in visit on 05/24/16 (from the past 24 hour(s))  POCT Urinalysis Dipstick     Status: None   Collection Time: 05/24/16 11:53 AM  Result Value Ref Range   Color, UA Yellow    Clarity, UA Clear    Glucose, UA Negative    Bilirubin, UA Negative    Ketones, UA Negative    Spec Grav, UA 1.010    Blood, UA Negative    pH, UA 7.5    Protein, UA Negative    Urobilinogen, UA 0.2    Nitrite, UA Negative    Leukocytes, UA Negative Negative   Assessment & Plan:   Problem List Items Addressed This Visit    Urinary frequency - Primary    New problem. UA negative. Doubt BPH given lack of nocturia and the fact that this started after starting Diamox. Discontinuing diamox. Patient to follow up in 2 weeks (phone or mychart).      Relevant Orders   POCT Urinalysis Dipstick (Completed)     Follow-up: 2 weeks (follow up via my chart or a phone call)  [place]

## 2016-05-24 NOTE — Progress Notes (Signed)
Pre visit review using our clinic review tool, if applicable. No additional management support is needed unless otherwise documented below in the visit note. 

## 2016-05-25 DIAGNOSIS — R350 Frequency of micturition: Secondary | ICD-10-CM | POA: Insufficient documentation

## 2016-05-25 NOTE — Assessment & Plan Note (Signed)
New problem. UA negative. Doubt BPH given lack of nocturia and the fact that this started after starting Diamox. Discontinuing diamox. Patient to follow up in 2 weeks (phone or mychart).

## 2016-06-06 ENCOUNTER — Other Ambulatory Visit: Payer: Self-pay | Admitting: Family Medicine

## 2016-06-06 MED ORDER — OSELTAMIVIR PHOSPHATE 75 MG PO CAPS: 75.0000 mg | ORAL_CAPSULE | Freq: Every day | ORAL | 0 refills | Status: DC

## 2016-06-08 ENCOUNTER — Encounter: Payer: Self-pay | Admitting: Family Medicine

## 2016-06-09 NOTE — Telephone Encounter (Signed)
Spouse called back in regards to son.  Call @ [phone]

## 2016-08-30 ENCOUNTER — Encounter: Payer: Self-pay | Admitting: Family Medicine

## 2016-09-05 ENCOUNTER — Ambulatory Visit (INDEPENDENT_AMBULATORY_CARE_PROVIDER_SITE_OTHER): Payer: 59 | Admitting: Family Medicine

## 2016-09-05 ENCOUNTER — Encounter: Payer: Self-pay | Admitting: Family Medicine

## 2016-09-05 DIAGNOSIS — F329 Major depressive disorder, single episode, unspecified: Secondary | ICD-10-CM

## 2016-09-05 DIAGNOSIS — F32A Depression, unspecified: Secondary | ICD-10-CM

## 2016-09-05 MED ORDER — BUPROPION HCL ER (XL) 150 MG PO TB24: ORAL_TABLET | ORAL | 0 refills | Status: DC

## 2016-09-05 NOTE — Patient Instructions (Signed)
Medication taper as we discussed.  We will set up the appt with the counselor.  Take care  Dr. Adriana Simas

## 2016-09-05 NOTE — Progress Notes (Signed)
Pre visit review using our clinic review tool, if applicable. No additional management support is needed unless otherwise documented below in the visit note. 

## 2016-09-05 NOTE — Assessment & Plan Note (Signed)
Improved. Wants to taper off Wellbutrin. 150 mg x 1 week then 1 tablet every other day x 1 week. Referring to psychology for counseling.

## 2016-09-05 NOTE — Progress Notes (Signed)
   Subjective:  Patient ID: Kevin [name], male    DOB: [birth year]  Age: 41 y.o. MRN: [id number]  CC: Depression  HPI:  41 year old male with depression presents for follow up.  Patient states he is doing well at this time. He wants to taper off Wellbutrin. He sent me a message regarding this and I told him to come into the week and discuss. He is interested in seeing a counselor to stay on top of his depression. He does report some anxiety. Overall, he is doing well.  Social Hx   Social History   Social History  . Marital status: Single    Spouse name: N/A  . Number of children: N/A  . Years of education: N/A   Social History Main Topics  . Smoking status: Never Smoker  . Smokeless tobacco: Never Used  . Alcohol use 6.0 oz/week    10 Cans of beer per week  . Drug use: Yes     Comment: acid- teenager  . Sexual activity: Yes    Birth control/ protection: None   Other Topics Concern  . None   Social History Narrative  . None    Review of Systems  Constitutional: Negative.   Psychiatric/Behavioral: The patient is nervous/anxious.    Objective:  BP (!) 128/94   Pulse 77   Temp 98.8 F (37.1 C) (Oral)   Wt 210 lb 6 oz (95.4 kg)   SpO2 95%   BMI 29.97 kg/m   BP/Weight 09/05/2016 05/24/2016 05/03/2016  Systolic BP 128 148 111  Diastolic BP 94 100 72  Wt. (Lbs) 210.38 214.4 214  BMI 29.97 30.54 30.49   Physical Exam  Constitutional: He is oriented to person, place, and time. He appears well-developed. No distress.  Cardiovascular: Normal rate and regular rhythm.   Pulmonary/Chest: Effort normal and breath sounds normal.  Neurological: He is alert and oriented to person, place, and time.  Psychiatric: He has a normal mood and affect.  Vitals reviewed.   Lab Results  Component Value Date   WBC 5.6 12/23/2015   HGB 15.7 12/23/2015   HCT 45.0 12/23/2015   PLT 229.0 12/23/2015   GLUCOSE 96 12/23/2015   CHOL 210 (H) 12/23/2015   TRIG 312.0 (H) 12/23/2015   HDL 36.90 (L) 12/23/2015   LDLDIRECT 121.0 12/23/2015   ALT 51 12/23/2015   AST 28 12/23/2015   NA 138 12/23/2015   K 4.3 12/23/2015   CL 103 12/23/2015   CREATININE 1.06 12/23/2015   BUN 14 12/23/2015   CO2 29 12/23/2015   HGBA1C 5.2 12/23/2015    Assessment & Plan:   Problem List Items Addressed This Visit    Depression    Improved. Wants to taper off Wellbutrin. 150 mg x 1 week then 1 tablet every other day x 1 week. Referring to psychology for counseling.       Relevant Medications   buPROPion (WELLBUTRIN XL) 150 MG 24 hr tablet      Meds ordered this encounter  Medications  . buPROPion (WELLBUTRIN XL) 150 MG 24 hr tablet    Sig: 150 mg once daily x 1 week, then every other day x 1 week.    Dispense:  11 tablet    Refill:  0   Follow-up: PRN  Everlene Other DO [facility]

## 2016-09-27 ENCOUNTER — Encounter: Payer: Self-pay | Admitting: Family Medicine

## 2016-09-27 ENCOUNTER — Other Ambulatory Visit: Payer: Self-pay | Admitting: Family Medicine

## 2016-09-27 DIAGNOSIS — F329 Major depressive disorder, single episode, unspecified: Secondary | ICD-10-CM

## 2016-09-27 DIAGNOSIS — F32A Depression, unspecified: Secondary | ICD-10-CM

## 2016-10-24 ENCOUNTER — Ambulatory Visit (INDEPENDENT_AMBULATORY_CARE_PROVIDER_SITE_OTHER): Payer: 59 | Admitting: Family

## 2016-10-24 ENCOUNTER — Encounter: Payer: Self-pay | Admitting: Family

## 2016-10-24 VITALS — BP 112/70 | HR 70 | Temp 98.6°F | Ht 70.0 in | Wt 211.0 lb

## 2016-10-24 DIAGNOSIS — M109 Gout, unspecified: Secondary | ICD-10-CM

## 2016-10-24 LAB — CBC WITH DIFFERENTIAL/PLATELET
BASOS PCT: 0.4 % (ref 0.0–3.0)
Basophils Absolute: 0 10*3/uL (ref 0.0–0.1)
EOS ABS: 0.1 10*3/uL (ref 0.0–0.7)
EOS PCT: 2.1 % (ref 0.0–5.0)
HCT: 42.7 % (ref 39.0–52.0)
HEMOGLOBIN: 15.1 g/dL (ref 13.0–17.0)
LYMPHS ABS: 2.1 10*3/uL (ref 0.7–4.0)
Lymphocytes Relative: 37.7 % (ref 12.0–46.0)
MCHC: 35.3 g/dL (ref 30.0–36.0)
MCV: 95.1 fl (ref 78.0–100.0)
MONO ABS: 0.6 10*3/uL (ref 0.1–1.0)
Monocytes Relative: 10.4 % (ref 3.0–12.0)
NEUTROS ABS: 2.8 10*3/uL (ref 1.4–7.7)
NEUTROS PCT: 49.4 % (ref 43.0–77.0)
Platelets: 240 10*3/uL (ref 150.0–400.0)
RBC: 4.49 Mil/uL (ref 4.22–5.81)
RDW: 12.3 % (ref 11.5–15.5)
WBC: 5.6 10*3/uL (ref 4.0–10.5)

## 2016-10-24 LAB — C-REACTIVE PROTEIN: CRP: 0.1 mg/dL — ABNORMAL LOW (ref 0.5–20.0)

## 2016-10-24 LAB — SEDIMENTATION RATE: Sed Rate: 1 mm/hr (ref 0–15)

## 2016-10-24 NOTE — Patient Instructions (Addendum)
Labs today  Uric acid when flare is down  Suspect gout   Let me know if doesn't continue to improve  Low-Purine Diet Purines are compounds that affect the level of uric acid in your body. A low-purine diet is a diet that is low in purines. Eating a low-purine diet can prevent the level of uric acid in your body from getting too high and causing gout or kidney stones or both. What do I need to know about this diet?  Choose low-purine foods. Examples of low-purine foods are listed in the next section.  Drink plenty of fluids, especially water. Fluids can help remove uric acid from your body. Try to drink 8-16 cups (1.9-3.8 L) a day.  Limit foods high in fat, especially saturated fat, as fat makes it harder for the body to get rid of uric acid. Foods high in saturated fat include pizza, cheese, ice cream, whole milk, fried foods, and gravies. Choose foods that are lower in fat and lean sources of protein. Use olive oil when cooking as it contains healthy fats that are not high in saturated fat.  Limit alcohol. Alcohol interferes with the elimination of uric acid from your body. If you are having a gout attack, avoid all alcohol.  Keep in mind that different people's bodies react differently to different foods. You will probably learn over time which foods do or do not affect you. If you discover that a food tends to cause your gout to flare up, avoid eating that food. You can more freely enjoy foods that do not cause problems. If you have any questions about a food item, talk to your dietitian or health care provider. Which foods are low, moderate, and high in purines? The following is a list of foods that are low, moderate, and high in purines. You can eat any amount of the foods that are low in purines. You may be able to have small amounts of foods that are moderate in purines. Ask your health care provider how much of a food moderate in purines you can have. Avoid foods high in  purines. Grains  Foods low in purines: Enriched white bread, pasta, rice, cake, cornbread, popcorn.  Foods moderate in purines: Whole-grain breads and cereals, wheat germ, bran, oatmeal. Uncooked oatmeal. Dry wheat bran or wheat germ.  Foods high in purines: Pancakes, [place] toast, biscuits, muffins. Vegetables  Foods low in purines: All vegetables, except those that are moderate in purines.  Foods moderate in purines: Asparagus, cauliflower, spinach, mushrooms, green peas. Fruits  All fruits are low in purines. Meats and other Protein Foods  Foods low in purines: Eggs, nuts, peanut butter.  Foods moderate in purines: 80-90% lean beef, lamb, veal, pork, poultry, fish, eggs, peanut butter, nuts. Crab, lobster, oysters, and shrimp. Cooked dried beans, peas, and lentils.  Foods high in purines: Anchovies, sardines, herring, mussels, tuna, codfish, scallops, trout, and haddock. Tomasa BlaseBacon. Organ meats (such as liver or kidney). Tripe. Game meat. Goose. Sweetbreads. Dairy  All dairy foods are low in purines. Low-fat and fat-free dairy products are best because they are low in saturated fat. Beverages  Drinks low in purines: Water, carbonated beverages, tea, coffee, cocoa.  Drinks moderate in purines: Soft drinks and other drinks sweetened with high-fructose corn syrup. Juices. To find whether a food or drink is sweetened with high-fructose corn syrup, look at the ingredients list.  Drinks high in purines: Alcoholic beverages (such as beer). Condiments  Foods low in purines: Salt, herbs, olives, pickles,  relishes, vinegar.  Foods moderate in purines: Butter, margarine, oils, mayonnaise. Fats and Oils  Foods low in purines: All types, except gravies and sauces made with meat.  Foods high in purines: Gravies and sauces made with meat. Other Foods  Foods low in purines: Sugars, sweets, gelatin. Cake. Soups made without meat.  Foods moderate in purines: Meat-based or fish-based soups,  broths, or bouillons. Foods and drinks sweetened with high-fructose corn syrup.  Foods high in purines: High-fat desserts (such as ice cream, cookies, cakes, pies, doughnuts, and chocolate). Contact your dietitian for more information on foods that are not listed here. This information is not intended to replace advice given to you by your health care provider. Make sure you discuss any questions you have with your health care provider. Document Released: 09/02/2010 Document Revised: 10/14/2015 Document Reviewed: 04/14/2013 Elsevier Interactive Patient Education  2017 [employer].

## 2016-10-24 NOTE — Progress Notes (Signed)
Pre visit review using our clinic review tool, if applicable. No additional management support is needed unless otherwise documented below in the visit note. 

## 2016-10-24 NOTE — Progress Notes (Signed)
Subjective:    Patient ID: Kevin ListerJoshua T [name], male    DOB: 08/14/1975, 41 y.o.   MRN: [id number]  CC: Kevin [name] is a 41 y.o. male who presents today for an acute visit.    HPI: CC: right second toe pain x 3 days, improved today. Pain with walking on foot. Pain onset was rapid. Noticed redness and swelling in toe, resolved now.   Works in [employer]. No injury. Has been using ice/ ibuprofen with some improvement.   No fever, N, V, athralgia.   No h/o gout, ckd, dm.     HISTORY:  Past Medical History:  Diagnosis Date  . Allergy   . Chicken pox    Past Surgical History:  Procedure Laterality Date  . ROTATOR CUFF REPAIR  2015  . VASECTOMY  2014   Family History  Problem Relation Age of Onset  . Hypertension Mother   . Hyperlipidemia Father   . Hyperlipidemia Maternal Grandmother   . Hypertension Maternal Grandmother   . Arthritis Maternal Grandmother     Allergies: Patient has no known allergies. Current Outpatient Prescriptions on File Prior to Visit  Medication Sig Dispense Refill  . LORazepam (ATIVAN) 0.5 MG tablet     . meclizine (ANTIVERT) 25 MG tablet Take 1 tablet (25 mg total) by mouth 3 (three) times daily as needed for dizziness. 30 tablet 0  . Melatonin 5 MG TABS Take 3.5 mg by mouth.    . Multiple Vitamin (MULTI-VITAMINS) TABS Take by mouth.    . Omega-3 Fatty Acids (FISH OIL PO) Take by mouth.    . valACYclovir (VALTREX) 1000 MG tablet   1   No current facility-administered medications on file prior to visit.     Social History  Substance Use Topics  . Smoking status: Never Smoker  . Smokeless tobacco: Never Used  . Alcohol use 6.0 oz/week    10 Cans of beer per week    Review of Systems  Constitutional: Negative for chills and fever.  Respiratory: Negative for cough.   Cardiovascular: Negative for chest pain and palpitations.  Gastrointestinal: Negative for nausea and vomiting.  Musculoskeletal: Positive for joint swelling.  Skin:  Positive for color change. Negative for rash.      Objective:    BP 112/70   Pulse 70   Temp 98.6 F (37 C) (Oral)   Ht 5\' 10"  (1.778 m)   Wt 211 lb (95.7 kg)   SpO2 97%   BMI 30.28 kg/m    Physical Exam  Constitutional: He appears well-developed and well-nourished.  Cardiovascular: Regular rhythm and normal heart sounds.   Pulmonary/Chest: Effort normal and breath sounds normal. No respiratory distress. He has no wheezes. He has no rhonchi. He has no rales.  Musculoskeletal:       Right ankle: He exhibits normal range of motion, no swelling and no ecchymosis. No tenderness.       Right foot: There is normal range of motion, no tenderness and no bony tenderness.       Feet:  Focal tenderness as noted on right metatarsal. No erythema, swelling, increased warmth noted.   Walking without a limp. Full ROM of toe.  Neurological: He is alert.  Skin: Skin is warm and dry.  Psychiatric: He has a normal mood and affect. His speech is normal and behavior is normal.  Vitals reviewed.      Assessment & Plan:   1. Acute gout involving toe of right foot, unspecified cause Symptoms consistent  with acute gout. No symptoms to support septic joint. Pain has subsided with NSAIDs. Education and expected management provided to patient. Advised to have PCP draw uric acid.  - C-reactive protein - Sedimentation rate - CBC with Differential/Platelet    I have discontinued [name]'s oseltamivir and buPROPion. I am also having him maintain his valACYclovir, MULTI-VITAMINS, Omega-3 Fatty Acids (FISH OIL PO), Melatonin, meclizine, and LORazepam.   No orders of the defined types were placed in this encounter.   Return precautions given.   Risks, benefits, and alternatives of the medications and treatment plan prescribed today were discussed, and patient expressed understanding.   Education regarding symptom management and diagnosis given to patient on AVS.  Continue to follow with  Tommie Sams, DO for routine health maintenance.   Kevin [name] and I agreed with plan.   Rennie Plowman, FNP

## 2016-11-03 ENCOUNTER — Other Ambulatory Visit: Payer: Self-pay | Admitting: Family Medicine

## 2016-11-09 ENCOUNTER — Ambulatory Visit (INDEPENDENT_AMBULATORY_CARE_PROVIDER_SITE_OTHER): Payer: 59 | Admitting: Psychology

## 2016-11-09 ENCOUNTER — Ambulatory Visit: Payer: Self-pay | Admitting: Psychology

## 2016-11-09 DIAGNOSIS — F411 Generalized anxiety disorder: Secondary | ICD-10-CM

## 2016-11-13 ENCOUNTER — Ambulatory Visit (INDEPENDENT_AMBULATORY_CARE_PROVIDER_SITE_OTHER): Payer: 59 | Admitting: Psychology

## 2016-11-13 DIAGNOSIS — F411 Generalized anxiety disorder: Secondary | ICD-10-CM

## 2016-12-21 ENCOUNTER — Ambulatory Visit (INDEPENDENT_AMBULATORY_CARE_PROVIDER_SITE_OTHER): Payer: 59 | Admitting: Psychology

## 2016-12-21 DIAGNOSIS — F411 Generalized anxiety disorder: Secondary | ICD-10-CM | POA: Diagnosis not present

## 2017-01-08 ENCOUNTER — Ambulatory Visit (INDEPENDENT_AMBULATORY_CARE_PROVIDER_SITE_OTHER): Payer: 59 | Admitting: Psychology

## 2017-01-08 DIAGNOSIS — F411 Generalized anxiety disorder: Secondary | ICD-10-CM | POA: Diagnosis not present

## 2017-01-24 DIAGNOSIS — H524 Presbyopia: Secondary | ICD-10-CM | POA: Diagnosis not present

## 2017-01-24 DIAGNOSIS — H5213 Myopia, bilateral: Secondary | ICD-10-CM | POA: Diagnosis not present

## 2017-01-25 ENCOUNTER — Ambulatory Visit (INDEPENDENT_AMBULATORY_CARE_PROVIDER_SITE_OTHER): Payer: 59 | Admitting: Psychology

## 2017-01-25 DIAGNOSIS — F411 Generalized anxiety disorder: Secondary | ICD-10-CM

## 2017-02-08 ENCOUNTER — Ambulatory Visit (INDEPENDENT_AMBULATORY_CARE_PROVIDER_SITE_OTHER): Payer: 59 | Admitting: Psychology

## 2017-02-08 DIAGNOSIS — F411 Generalized anxiety disorder: Secondary | ICD-10-CM

## 2017-02-19 ENCOUNTER — Ambulatory Visit (INDEPENDENT_AMBULATORY_CARE_PROVIDER_SITE_OTHER): Payer: 59 | Admitting: Psychology

## 2017-02-19 DIAGNOSIS — F411 Generalized anxiety disorder: Secondary | ICD-10-CM | POA: Diagnosis not present

## 2017-03-08 ENCOUNTER — Ambulatory Visit (INDEPENDENT_AMBULATORY_CARE_PROVIDER_SITE_OTHER): Payer: 59 | Admitting: Psychology

## 2017-03-08 DIAGNOSIS — F411 Generalized anxiety disorder: Secondary | ICD-10-CM | POA: Diagnosis not present

## 2017-03-22 ENCOUNTER — Ambulatory Visit (INDEPENDENT_AMBULATORY_CARE_PROVIDER_SITE_OTHER): Payer: 59 | Admitting: Psychology

## 2017-03-22 DIAGNOSIS — F411 Generalized anxiety disorder: Secondary | ICD-10-CM | POA: Diagnosis not present

## 2017-04-10 ENCOUNTER — Ambulatory Visit: Payer: 59 | Admitting: Psychology

## 2017-04-10 DIAGNOSIS — F411 Generalized anxiety disorder: Secondary | ICD-10-CM | POA: Diagnosis not present

## 2017-04-23 ENCOUNTER — Ambulatory Visit: Payer: 59 | Admitting: Psychology

## 2017-04-23 DIAGNOSIS — F411 Generalized anxiety disorder: Secondary | ICD-10-CM | POA: Diagnosis not present

## 2017-05-09 ENCOUNTER — Ambulatory Visit: Payer: 59 | Admitting: Psychology

## 2017-05-29 ENCOUNTER — Ambulatory Visit: Payer: 59 | Admitting: Psychology

## 2017-05-29 DIAGNOSIS — F411 Generalized anxiety disorder: Secondary | ICD-10-CM

## 2017-06-14 ENCOUNTER — Ambulatory Visit: Payer: 59 | Admitting: Psychology

## 2017-07-11 ENCOUNTER — Ambulatory Visit: Payer: 59 | Admitting: Psychology

## 2017-07-11 DIAGNOSIS — F411 Generalized anxiety disorder: Secondary | ICD-10-CM

## 2017-08-17 ENCOUNTER — Ambulatory Visit: Payer: 59 | Admitting: Psychology

## 2017-08-27 ENCOUNTER — Ambulatory Visit: Payer: 59 | Admitting: Psychology

## 2017-08-27 DIAGNOSIS — F411 Generalized anxiety disorder: Secondary | ICD-10-CM

## 2017-09-20 ENCOUNTER — Encounter: Payer: Self-pay | Admitting: Internal Medicine

## 2017-09-20 ENCOUNTER — Ambulatory Visit: Payer: 59 | Admitting: Internal Medicine

## 2017-09-20 ENCOUNTER — Ambulatory Visit (INDEPENDENT_AMBULATORY_CARE_PROVIDER_SITE_OTHER): Payer: 59

## 2017-09-20 VITALS — BP 122/80 | HR 59 | Temp 98.3°F | Ht 70.0 in | Wt 208.8 lb

## 2017-09-20 DIAGNOSIS — E559 Vitamin D deficiency, unspecified: Secondary | ICD-10-CM

## 2017-09-20 DIAGNOSIS — M25512 Pain in left shoulder: Secondary | ICD-10-CM

## 2017-09-20 DIAGNOSIS — Z13220 Encounter for screening for lipoid disorders: Secondary | ICD-10-CM | POA: Diagnosis not present

## 2017-09-20 DIAGNOSIS — Z0184 Encounter for antibody response examination: Secondary | ICD-10-CM

## 2017-09-20 DIAGNOSIS — Z1329 Encounter for screening for other suspected endocrine disorder: Secondary | ICD-10-CM

## 2017-09-20 DIAGNOSIS — F419 Anxiety disorder, unspecified: Secondary | ICD-10-CM | POA: Diagnosis not present

## 2017-09-20 DIAGNOSIS — Z0000 Encounter for general adult medical examination without abnormal findings: Secondary | ICD-10-CM | POA: Diagnosis not present

## 2017-09-20 DIAGNOSIS — Z1159 Encounter for screening for other viral diseases: Secondary | ICD-10-CM

## 2017-09-20 DIAGNOSIS — F329 Major depressive disorder, single episode, unspecified: Secondary | ICD-10-CM

## 2017-09-20 DIAGNOSIS — G8929 Other chronic pain: Secondary | ICD-10-CM

## 2017-09-20 DIAGNOSIS — G4700 Insomnia, unspecified: Secondary | ICD-10-CM | POA: Diagnosis not present

## 2017-09-20 DIAGNOSIS — Z1389 Encounter for screening for other disorder: Secondary | ICD-10-CM | POA: Diagnosis not present

## 2017-09-20 DIAGNOSIS — F32A Depression, unspecified: Secondary | ICD-10-CM

## 2017-09-20 LAB — COMPREHENSIVE METABOLIC PANEL
ALBUMIN: 4.7 g/dL (ref 3.5–5.2)
ALT: 40 U/L (ref 0–53)
AST: 26 U/L (ref 0–37)
Alkaline Phosphatase: 73 U/L (ref 39–117)
BUN: 16 mg/dL (ref 6–23)
CHLORIDE: 100 meq/L (ref 96–112)
CO2: 29 meq/L (ref 19–32)
Calcium: 9.5 mg/dL (ref 8.4–10.5)
Creatinine, Ser: 1.04 mg/dL (ref 0.40–1.50)
GFR: 82.5 mL/min (ref >60.00)
GLUCOSE: 91 mg/dL (ref 70–99)
Potassium: 4.4 mEq/L (ref 3.5–5.1)
SODIUM: 138 meq/L (ref 135–145)
Total Bilirubin: 0.7 mg/dL (ref 0.2–1.2)
Total Protein: 7.7 g/dL (ref 6.0–8.3)

## 2017-09-20 LAB — CBC WITH DIFFERENTIAL/PLATELET
BASOS PCT: 0.8 % (ref 0.0–3.0)
Basophils Absolute: 0 10*3/uL (ref 0.0–0.1)
Eosinophils Absolute: 0.1 10*3/uL (ref 0.0–0.7)
Eosinophils Relative: 2.6 % (ref 0.0–5.0)
HCT: 46.6 % (ref 39.0–52.0)
Hemoglobin: 16.4 g/dL (ref 13.0–17.0)
LYMPHS ABS: 2.1 10*3/uL (ref 0.7–4.0)
Lymphocytes Relative: 42.8 % (ref 12.0–46.0)
MCHC: 35.2 g/dL (ref 30.0–36.0)
MCV: 96.6 fl (ref 78.0–100.0)
MONO ABS: 0.7 10*3/uL (ref 0.1–1.0)
Monocytes Relative: 14.6 % — ABNORMAL HIGH (ref 3.0–12.0)
NEUTROS ABS: 1.9 10*3/uL (ref 1.4–7.7)
Neutrophils Relative %: 39.2 % — ABNORMAL LOW (ref 43.0–77.0)
PLATELETS: 210 10*3/uL (ref 150.0–400.0)
RBC: 4.83 Mil/uL (ref 4.22–5.81)
RDW: 12.4 % (ref 11.5–15.5)
WBC: 4.8 10*3/uL (ref 4.0–10.5)

## 2017-09-20 LAB — TSH: TSH: 1.07 u[IU]/mL (ref 0.35–4.50)

## 2017-09-20 LAB — VITAMIN D 25 HYDROXY (VIT D DEFICIENCY, FRACTURES): VITD: 24.3 ng/mL — AB (ref 30.00–100.00)

## 2017-09-20 LAB — LIPID PANEL
Cholesterol: 268 mg/dL — ABNORMAL HIGH (ref 0–200)
HDL: 44.9 mg/dL (ref >39.00)
NONHDL: 223.32
Total CHOL/HDL Ratio: 6
Triglycerides: 287 mg/dL — ABNORMAL HIGH (ref 0.0–149.0)
VLDL: 57.4 mg/dL — ABNORMAL HIGH (ref 0.0–40.0)

## 2017-09-20 LAB — T4, FREE: FREE T4: 0.83 ng/dL (ref 0.60–1.60)

## 2017-09-20 LAB — LDL CHOLESTEROL, DIRECT: LDL DIRECT: 163 mg/dL

## 2017-09-20 NOTE — Patient Instructions (Signed)
F/u in 6-12 months sooner if needed   Shoulder Pain Many things can cause shoulder pain, including:  An injury to the area.  Overuse of the shoulder.  Arthritis.  The source of the pain can be:  Inflammation.  An injury to the shoulder joint.  An injury to a tendon, ligament, or bone.  Follow these instructions at home: Take these actions to help with your pain:  Squeeze a soft ball or a foam pad as much as possible. This helps to keep the shoulder from swelling. It also helps to strengthen the arm.  Take over-the-counter and prescription medicines only as told by your health care provider.  If directed, apply ice to the area: ? Put ice in a plastic bag. ? Place a towel between your skin and the bag. ? Leave the ice on for 20 minutes, 2-3 times per day. Stop applying ice if it does not help with the pain.  If you were given a shoulder sling or immobilizer: ? Wear it as told. ? Remove it to shower or bathe. ? Move your arm as little as possible, but keep your hand moving to prevent swelling.  Contact a health care provider if:  Your pain gets worse.  Your pain is not relieved with medicines.  New pain develops in your arm, hand, or fingers. Get help right away if:  Your arm, hand, or fingers: ? Tingle. ? Become numb. ? Become swollen. ? Become painful. ? Turn white or blue. This information is not intended to replace advice given to you by your health care provider. Make sure you discuss any questions you have with your health care provider. Document Released: 02/15/2005 Document Revised: 01/02/2016 Document Reviewed: 08/31/2014 Elsevier Interactive Patient Education  [employer].

## 2017-09-20 NOTE — Progress Notes (Signed)
Chief Complaint  Patient presents with  . Annual Exam   Annual exam  1. C/o left shoulder pain 3/10 x 1-2 years shoulder catches notices with working out and also with extension of arm laterally >90 degrees. He is ok with doing push up He wants to go to [facility]. [facility] pending results of Xray  2. Mood/anxiety improved with prozac 20 mg he sees NP in [place] and therapy with [facility] He does report trouble with staying asleep   Review of Systems  Constitutional: Positive for weight loss.  HENT: Negative for hearing loss.   Eyes: Negative for blurred vision.  Respiratory: Negative for shortness of breath.   Cardiovascular: Negative for chest pain.  Gastrointestinal: Negative for abdominal pain.  Musculoskeletal: Positive for joint pain.  Skin: Negative for rash.  Neurological: Negative for headaches.  Psychiatric/Behavioral: Negative for depression. The patient is nervous/anxious and has insomnia.    Past Medical History:  Diagnosis Date  . Allergy   . Chicken pox   . Shoulder pain    Past Surgical History:  Procedure Laterality Date  . ROTATOR CUFF REPAIR  2015  . VASECTOMY  2014   Family History  Problem Relation Age of Onset  . Hypertension Mother   . Hyperlipidemia Father   . Hyperlipidemia Maternal Grandmother   . Hypertension Maternal Grandmother   . Arthritis Maternal Grandmother    Social History   Socioeconomic History  . Marital status: Single    Spouse name: Not on file  . Number of children: Not on file  . Years of education: Not on file  . Highest education level: Not on file  Occupational History  . Not on file  Social Needs  . Financial resource strain: Not on file  . Food insecurity:    Worry: Not on file    Inability: Not on file  . Transportation needs:    Medical: Not on file    Non-medical: Not on file  Tobacco Use  . Smoking status: Never Smoker  . Smokeless tobacco: Never Used  Substance and Sexual Activity  .  Alcohol use: Yes    Alcohol/week: 6.0 oz    Types: 10 Cans of beer per week  . Drug use: Yes    Comment: acid- teenager  . Sexual activity: Yes    Birth control/protection: None  Lifestyle  . Physical activity:    Days per week: Not on file    Minutes per session: Not on file  . Stress: Not on file  Relationships  . Social connections:    Talks on phone: Not on file    Gets together: Not on file    Attends religious service: Not on file    Active member of club or organization: Not on file    Attends meetings of clubs or organizations: Not on file    Relationship status: Not on file  . Intimate partner violence:    Fear of current or ex partner: Not on file    Emotionally abused: Not on file    Physically abused: Not on file    Forced sexual activity: Not on file  Other Topics Concern  . Not on file  Social History Narrative   Married    Surveyor, minerals    Current Meds  Medication Sig  . FLUoxetine (PROZAC) 20 MG capsule Take 20 mg by mouth daily.  Marland Kitchen loratadine (CLARITIN) 10 MG tablet Take 10 mg by mouth daily.  Marland Kitchen LORazepam (ATIVAN) 0.5 MG tablet   .  meclizine (ANTIVERT) 25 MG tablet Take 1 tablet (25 mg total) by mouth 3 (three) times daily as needed for dizziness.  . Multiple Vitamin (MULTI-VITAMINS) TABS Take by mouth.  . Omega-3 Fatty Acids (FISH OIL PO) Take by mouth.  . valACYclovir (VALTREX) 1000 MG tablet    No Known Allergies No results found for this or any previous visit (from the past 2160 hour(s)). Objective  Body mass index is 29.96 kg/m. Wt Readings from Last 3 Encounters:  09/20/17 208 lb 12.8 oz (94.7 kg)  10/24/16 211 lb (95.7 kg)  09/05/16 210 lb 6 oz (95.4 kg)   Temp Readings from Last 3 Encounters:  09/20/17 98.3 F (36.8 C) (Oral)  10/24/16 98.6 F (37 C) (Oral)  09/05/16 98.8 F (37.1 C) (Oral)   BP Readings from Last 3 Encounters:  09/20/17 122/80  10/24/16 112/70  09/05/16 (!) 128/94   Pulse Readings from Last 3 Encounters:   09/20/17 (!) 59  10/24/16 70  09/05/16 77    Physical Exam  Constitutional: He is oriented to person, place, and time. Vital signs are normal. He appears well-developed and well-nourished. He is cooperative.  HENT:  Head: Normocephalic and atraumatic.  Mouth/Throat: Oropharynx is clear and moist and mucous membranes are normal.  Eyes: Pupils are equal, round, and reactive to light. Conjunctivae are normal.  Cardiovascular: Normal rate, regular rhythm and normal heart sounds.  Pulmonary/Chest: Effort normal and breath sounds normal.  Neurological: He is alert and oriented to person, place, and time. Gait normal.  Skin: Skin is warm, dry and intact.  Psychiatric: He has a normal mood and affect. His speech is normal and behavior is normal. Judgment and thought content normal. Cognition and memory are normal.  Nursing note and vitals reviewed.   Assessment   1. Annual exam  2. Left shoulder pain  3. HM 4. Depression/anxiety/insomnia Plan  1.  Labs today  Cont exercise to 2x per week healthy diet choices  No need for dermatology now benign nevi Never flu  UTD Tdap  2.  Xray today  Refer to Dr. Talmadge [employer] [facility] if [facility] orthopedics  3.  See above 4. F/u with therapy [facility] [place] and NP psych prozac 20 mg qd helping    Provider: Dr. French Ana McLean-Scocuz[facility]

## 2017-09-20 NOTE — Progress Notes (Signed)
Pre visit review using our clinic review tool, if applicable. No additional management support is needed unless otherwise documented below in the visit note. 

## 2017-09-21 LAB — URINALYSIS, ROUTINE W REFLEX MICROSCOPIC
BILIRUBIN UA: NEGATIVE
GLUCOSE, UA: NEGATIVE
Ketones, UA: NEGATIVE
Leukocytes, UA: NEGATIVE
Nitrite, UA: NEGATIVE
PROTEIN UA: NEGATIVE
RBC, UA: NEGATIVE
Specific Gravity, UA: 1.019 (ref 1.005–1.030)
UUROB: 0.2 mg/dL (ref 0.2–1.0)
pH, UA: 7.5 (ref 5.0–7.5)

## 2017-09-21 LAB — HEPATITIS B SURFACE ANTIBODY, QUANTITATIVE

## 2017-09-26 ENCOUNTER — Telehealth: Payer: Self-pay

## 2017-09-26 NOTE — Telephone Encounter (Signed)
-----   Message from Bevelyn Buckles, MD sent at 09/21/2017  6:33 PM EDT ----- Vitamin D low please take D3 5000 IU qd OTC  Cholesterol #s worse I rec cholesterol medication -direct LDL 163 goal <130, total cholesterol 268 goal <200, Trig 287 goal <150 -is he ok with this?  -please mail cholesterol handout, try healthy diet choices and exercise  Liver kidneys normal  Thyroid labs normal  Blood cts normal  rec hep B vaccine   I need to know if he wants me to order MRI of shoulder ?  TMS

## 2017-09-26 NOTE — Telephone Encounter (Signed)
Left message for patient to return call back. [facility] may give results and obtain information.  

## 2017-09-26 NOTE — Telephone Encounter (Signed)
Left message for pt to return call for lab results.

## 2017-09-26 NOTE — Telephone Encounter (Signed)
Pt returning call about labs  

## 2017-09-27 ENCOUNTER — Telehealth: Payer: Self-pay | Admitting: Internal Medicine

## 2017-09-27 NOTE — Telephone Encounter (Signed)
Call pt and disc normal values of cholesterol panel TC <200 TG<150, LDL <130 w/o BP issues and <100 with, HDL 40  Mail cholesterol handout   Disc need for hep B vaccine hep B protection blood and bodily fluid   TMS

## 2017-09-27 NOTE — Telephone Encounter (Signed)
He called in and was given lab results ordered by Dr. Judie Grieve on 09/20/17.  Vitamin D low, please take D3 5,000 IU every day OTC. Cholesterol numbers are worse.   I recommend cholesterol medication.  -  Direct LDL 163 goal is <130,  Total cholesterol goal is  <200 yours is 268,  Triglycerides goal <150, you number is 287.   Is he ok with starting a cholesterol medication?  Try healthy diet choices and exercise.   The office will mail a cholesterol handout.  Liver and kidneys are normal. Thyroid labs are normal. Blood counts normal.   He wants to think about taking the cholesterol medication.   He is wanting to lower his numbers with diet and exercise but wondering if they were  logical with his numbers.   He is going to call  us back and let us know his decision regarding the cholesterol medication.   He is wondering if he needs the Hepatitis B vaccine since he does not have immunity.  I have routed this note to Dr. Judie Grieve at the [facility].

## 2017-09-27 NOTE — Telephone Encounter (Signed)
Please advise 

## 2017-09-27 NOTE — Telephone Encounter (Signed)
Spoken to patient. He will discuss cholesterol medication during appointment.  He is ok with Hep B vaccine

## 2017-10-17 ENCOUNTER — Ambulatory Visit: Payer: 59 | Admitting: Psychology

## 2017-10-17 DIAGNOSIS — F411 Generalized anxiety disorder: Secondary | ICD-10-CM | POA: Diagnosis not present

## 2017-11-26 ENCOUNTER — Ambulatory Visit: Payer: 59 | Admitting: Internal Medicine

## 2017-11-26 ENCOUNTER — Encounter: Payer: Self-pay | Admitting: Internal Medicine

## 2017-11-26 VITALS — BP 128/84 | HR 71 | Temp 99.1°F | Ht 70.0 in | Wt 213.0 lb

## 2017-11-26 DIAGNOSIS — J0100 Acute maxillary sinusitis, unspecified: Secondary | ICD-10-CM | POA: Diagnosis not present

## 2017-11-26 MED ORDER — AMOXICILLIN 500 MG PO TABS: 1000.0000 mg | ORAL_TABLET | Freq: Two times a day (BID) | ORAL | 0 refills | Status: AC

## 2017-11-26 NOTE — Assessment & Plan Note (Signed)
May still be viral Discussed analgesics If worsens in the next few days, start the antibiotic

## 2017-11-26 NOTE — Patient Instructions (Signed)
Please start the amoxicillin antibiotic if you are worsening in the next few days. 

## 2017-11-26 NOTE — Progress Notes (Signed)
Subjective:    Patient ID: Kevin [name], male    DOB: 03/26/1976, 42 y.o.   MRN: [id number]  HPI Here due to respiratory infection  Has a lot of sinus pressure but doesn't feel that bad Sore throat at night--affecting his sleep Post nasal drip--affects voice in the morning  Maxillary pressure Some left ear aching  This all started about 5 days ago No SOB No fever No chills or sweats No headache  Taking mucinex-d --no help nyquil helps him sleep  Current Outpatient Medications on File Prior to Visit  Medication Sig Dispense Refill  . FLUoxetine (PROZAC) 20 MG capsule Take 20 mg by mouth daily.    Marland Kitchen. loratadine (CLARITIN) 10 MG tablet Take 10 mg by mouth daily.    Marland Kitchen. LORazepam (ATIVAN) 0.5 MG tablet     . meclizine (ANTIVERT) 25 MG tablet Take 1 tablet (25 mg total) by mouth 3 (three) times daily as needed for dizziness. 30 tablet 0  . Multiple Vitamin (MULTI-VITAMINS) TABS Take by mouth.    . Omega-3 Fatty Acids (FISH OIL PO) Take by mouth.    . valACYclovir (VALTREX) 1000 MG tablet   1   No current facility-administered medications on file prior to visit.     No Known Allergies  Past Medical History:  Diagnosis Date  . Allergy   . Chicken pox   . Shoulder pain     Past Surgical History:  Procedure Laterality Date  . ROTATOR CUFF REPAIR  2015  . VASECTOMY  2014    Family History  Problem Relation Age of Onset  . Hypertension Mother   . Hyperlipidemia Father   . Hyperlipidemia Maternal Grandmother   . Hypertension Maternal Grandmother   . Arthritis Maternal Grandmother     Social History   Socioeconomic History  . Marital status: Single    Spouse name: Not on file  . Number of children: Not on file  . Years of education: Not on file  . Highest education level: Not on file  Occupational History  . Not on file  Social Needs  . Financial resource strain: Not on file  . Food insecurity:    Worry: Not on file    Inability: Not on file  .  Transportation needs:    Medical: Not on file    Non-medical: Not on file  Tobacco Use  . Smoking status: Never Smoker  . Smokeless tobacco: Never Used  Substance and Sexual Activity  . Alcohol use: Yes    Alcohol/week: 6.0 oz    Types: 10 Cans of beer per week  . Drug use: Yes    Comment: acid- teenager  . Sexual activity: Yes    Birth control/protection: None  Lifestyle  . Physical activity:    Days per week: Not on file    Minutes per session: Not on file  . Stress: Not on file  Relationships  . Social connections:    Talks on phone: Not on file    Gets together: Not on file    Attends religious service: Not on file    Active member of club or organization: Not on file    Attends meetings of clubs or organizations: Not on file    Relationship status: Not on file  . Intimate partner violence:    Fear of current or ex partner: Not on file    Emotionally abused: Not on file    Physically abused: Not on file    Forced sexual activity:  Not on file  Other Topics Concern  . Not on file  Social History Narrative   Married    Surveyor, minerals    Review of Systems  No rash No vomiting Slight loose stool Eating okay Does have allergies---takes claritin daily     Objective:   Physical Exam  Constitutional: He appears well-developed. No distress.  HENT:  Mouth/Throat: Oropharynx is clear and moist. No oropharyngeal exudate.  No sinus tenderness TMs normal Moderate nasal inflammation   Neck: No thyromegaly present.  Respiratory: Effort normal and breath sounds normal. No respiratory distress. He has no wheezes. He has no rales.  Lymphadenopathy:    He has no cervical adenopathy.           Assessment & Plan:

## 2017-12-10 NOTE — Progress Notes (Signed)
Subjective:    Patient ID: Kevin [name], male    DOB: 04/30/1976, 42 y.o.   MRN: [id number]  HPI  Mr. Kevin [name] is a 42 year old male who presents today with decreased hearing or "plugged" right ear x 1 week. Tinnitus: No Vertigo: No, history of vertigo Trauma/injury: No Ear pain: One episode of discomfort noted Ear drainage: No Fever: No Rhinorrhea: No Sinus pressure: No HA: No Diplopia: No Eye pain/erythema: No History of hearing loss: No  He was evaluated and treated on 11/26/17  for acute non-recurrent maxillary sinusitis. He was provided amoxicillin if symptoms were to worsen after conservative measures. Patient completed amoxicillin as prescribed without adverse effects reported. Maxillary sinus symptoms have improved.   He reports that [place]    Review of Systems  Constitutional: Negative for chills, fatigue and fever.  HENT: Positive for ear pain. Negative for postnasal drip, rhinorrhea, sinus pressure, sinus pain, sneezing, sore throat, tinnitus and trouble swallowing.        Decreased hearing right ear  Respiratory: Negative for cough, shortness of breath and wheezing.   Cardiovascular: Negative for chest pain and palpitations.  Gastrointestinal: Negative for abdominal pain.  Skin: Negative for rash.  Neurological: Negative for dizziness.   Past Medical History:  Diagnosis Date  . Allergy   . Chicken pox   . Shoulder pain      Social History   Socioeconomic History  . Marital status: Single    Spouse name: Not on file  . Number of children: Not on file  . Years of education: Not on file  . Highest education level: Not on file  Occupational History  . Not on file  Social Needs  . Financial resource strain: Not on file  . Food insecurity:    Worry: Not on file    Inability: Not on file  . Transportation needs:    Medical: Not on file    Non-medical: Not on file  Tobacco Use  . Smoking  status: Never Smoker  . Smokeless tobacco: Never Used  Substance and Sexual Activity  . Alcohol use: Yes    Alcohol/week: 6.0 oz    Types: 10 Cans of beer per week  . Drug use: Yes    Comment: acid- teenager  . Sexual activity: Yes    Birth control/protection: None  Lifestyle  . Physical activity:    Days per week: Not on file    Minutes per session: Not on file  . Stress: Not on file  Relationships  . Social connections:    Talks on phone: Not on file    Gets together: Not on file    Attends religious service: Not on file    Active member of club or organization: Not on file    Attends meetings of clubs or organizations: Not on file    Relationship status: Not on file  . Intimate partner violence:    Fear of current or ex partner: Not on file    Emotionally abused: Not on file    Physically abused: Not on file    Forced sexual activity: Not on file  Other Topics Concern  . Not on file  Social History Narrative   Married    Surveyor, mineralsContractor     Past Surgical History:  Procedure Laterality Date  . ROTATOR CUFF REPAIR  2015  . VASECTOMY  2014    Family History  Problem Relation Age of Onset  .  Hypertension Mother   . Hyperlipidemia Father   . Hyperlipidemia Maternal Grandmother   . Hypertension Maternal Grandmother   . Arthritis Maternal Grandmother     No Known Allergies  Current Outpatient Medications on File Prior to Visit  Medication Sig Dispense Refill  . FLUoxetine (PROZAC) 20 MG capsule Take 20 mg by mouth daily.    Marland Kitchen loratadine (CLARITIN) 10 MG tablet Take 10 mg by mouth daily.    Marland Kitchen LORazepam (ATIVAN) 0.5 MG tablet     . meclizine (ANTIVERT) 25 MG tablet Take 1 tablet (25 mg total) by mouth 3 (three) times daily as needed for dizziness. 30 tablet 0  . Multiple Vitamin (MULTI-VITAMINS) TABS Take by mouth.    . Omega-3 Fatty Acids (FISH OIL PO) Take by mouth.    . valACYclovir (VALTREX) 1000 MG tablet   1   No current facility-administered medications on  file prior to visit.     BP 114/74 (BP Location: Left Arm, Patient Position: Sitting, Cuff Size: Large)   Pulse (!) 56   Temp 98.6 F (37 C) (Oral)   Resp 16   Wt 214 lb 6 oz (97.2 kg)   SpO2 96%   BMI 30.76 kg/m       Objective:   Physical Exam  Constitutional: He is oriented to person, place, and time. He appears well-developed and well-nourished.  HENT:  Right Ear: Tympanic membrane normal.  Left Ear: Tympanic membrane normal.  Nose: Right sinus exhibits no maxillary sinus tenderness and no frontal sinus tenderness. Left sinus exhibits no maxillary sinus tenderness and no frontal sinus tenderness.  Mouth/Throat: Oropharynx is clear and moist and mucous membranes are normal.  Right external canal erythematous with minimal edema present. Ear canal not occluded. Right TM intact. No mastoid tenderness/erythema; no cervical adenopathy present. Minimal discomfort with movement of right ear. Left TM: WNL. Turbinates mildly erythematous  Eyes: Pupils are equal, round, and reactive to light. No scleral icterus.  Neck: Neck supple.  Cardiovascular: Normal rate, regular rhythm and normal heart sounds.  Pulmonary/Chest: Effort normal and breath sounds normal. He has no wheezes. He has no rales.  Lymphadenopathy:    He has no cervical adenopathy.  Neurological: He is alert and oriented to person, place, and time.  Rinne: AC>BC Weber: WNL  Skin: Skin is warm and dry. No erythema.          Assessment & Plan:  1. Acute otitis externa of right ear, unspecified type Exam and history are most consistent with otitis externa. Prior symptoms of sinusitis have improved per patient. Will provide Cortisporin Otic solution and ibuprofen for discomfort as needed. Advised close follow up if symptoms worsen or new symptoms develop. Hearing intact today; Rinne and Weber WNL. He will continue use of either Allegra, Claritin, or Zyrtec as needed. We reviewed preventive measures and close return  precautions provided.   - neomycin-polymyxin-hydrocortisone (CORTISPORIN) OTIC solution; Place 4 drops into the right ear 3 (three) times daily.  Dispense: 10 mL; Refill: 0  Roddie Mc, FNP-C

## 2017-12-11 ENCOUNTER — Encounter: Payer: Self-pay | Admitting: Family Medicine

## 2017-12-11 ENCOUNTER — Ambulatory Visit: Payer: 59 | Admitting: Family Medicine

## 2017-12-11 VITALS — BP 114/74 | HR 56 | Temp 98.6°F | Resp 16 | Wt 214.4 lb

## 2017-12-11 DIAGNOSIS — H60501 Unspecified acute noninfective otitis externa, right ear: Secondary | ICD-10-CM | POA: Diagnosis not present

## 2017-12-11 MED ORDER — NEOMYCIN-POLYMYXIN-HC 3.5-10000-1 OT SOLN: 4.0000 [drp] | Freq: Three times a day (TID) | OTIC | 0 refills | Status: DC

## 2017-12-11 NOTE — Patient Instructions (Signed)
Please use drops as directed and you can add either Allegra, Claritin, or Zyrtec for symptoms.  If no improvement or worsening symptoms, follow up for further evaluation and treatment.  Feel better soon!   Otitis Externa Otitis externa is an infection of the outer ear canal. The outer ear canal is the area between the outside of the ear and the eardrum. Otitis externa is sometimes called "swimmer's ear." Follow these instructions at home:  If you were given antibiotic ear drops, use them as told by your doctor. Do not stop using them even if your condition gets better.  Take over-the-counter and prescription medicines only as told by your doctor.  Keep all follow-up visits as told by your doctor. This is important. How is this prevented?  Keep your ear dry. Use the corner of a towel to dry your ear after you swim or bathe.  Try not to scratch or put things in your ear. Doing these things makes it easier for germs to grow in your ear.  Avoid swimming in lakes, dirty water, or pools that may not have the right amount of a chemical called chlorine.  Consider making ear drops and putting 3 or 4 drops in each ear after you swim. Ask your doctor about how you can make ear drops. Contact a doctor if:  You have a fever.  After 3 days your ear is still red, swollen, or painful.  After 3 days you still have pus coming from your ear.  Your redness, swelling, or pain gets worse.  You have a really bad headache.  You have redness, swelling, pain, or tenderness behind your ear. This information is not intended to replace advice given to you by your health care provider. Make sure you discuss any questions you have with your health care provider. Document Released: 10/25/2007 Document Revised: 06/03/2015 Document Reviewed: 02/15/2015 Elsevier Interactive Patient Education  [employer]2018 [employer]

## 2017-12-12 ENCOUNTER — Ambulatory Visit: Payer: 59 | Admitting: Psychology

## 2017-12-12 DIAGNOSIS — F411 Generalized anxiety disorder: Secondary | ICD-10-CM

## 2018-01-29 DIAGNOSIS — H524 Presbyopia: Secondary | ICD-10-CM | POA: Diagnosis not present

## 2018-01-29 DIAGNOSIS — H5213 Myopia, bilateral: Secondary | ICD-10-CM | POA: Diagnosis not present

## 2018-02-06 ENCOUNTER — Ambulatory Visit: Payer: 59 | Admitting: Psychology

## 2018-02-06 DIAGNOSIS — F411 Generalized anxiety disorder: Secondary | ICD-10-CM | POA: Diagnosis not present

## 2018-02-26 ENCOUNTER — Telehealth: Payer: Self-pay | Admitting: Internal Medicine

## 2018-02-26 DIAGNOSIS — M47816 Spondylosis without myelopathy or radiculopathy, lumbar region: Secondary | ICD-10-CM | POA: Diagnosis not present

## 2018-02-26 DIAGNOSIS — M5440 Lumbago with sciatica, unspecified side: Secondary | ICD-10-CM | POA: Diagnosis not present

## 2018-02-26 DIAGNOSIS — G8929 Other chronic pain: Secondary | ICD-10-CM | POA: Diagnosis not present

## 2018-02-26 DIAGNOSIS — M47896 Other spondylosis, lumbar region: Secondary | ICD-10-CM | POA: Diagnosis not present

## 2018-02-26 NOTE — Telephone Encounter (Signed)
Xray pelvis/hips normal  Xray low back mild arthritis lower spine with mild narrowed disc space  Does he want to try PT?  TMS

## 2018-02-27 NOTE — Telephone Encounter (Signed)
mychart message has been sent to inform patient. 

## 2018-03-21 DIAGNOSIS — M5442 Lumbago with sciatica, left side: Secondary | ICD-10-CM | POA: Diagnosis not present

## 2018-03-21 DIAGNOSIS — M9903 Segmental and somatic dysfunction of lumbar region: Secondary | ICD-10-CM | POA: Diagnosis not present

## 2018-03-21 DIAGNOSIS — M545 Low back pain: Secondary | ICD-10-CM | POA: Diagnosis not present

## 2018-03-25 DIAGNOSIS — M9903 Segmental and somatic dysfunction of lumbar region: Secondary | ICD-10-CM | POA: Diagnosis not present

## 2018-03-25 DIAGNOSIS — M5442 Lumbago with sciatica, left side: Secondary | ICD-10-CM | POA: Diagnosis not present

## 2018-03-25 DIAGNOSIS — M545 Low back pain: Secondary | ICD-10-CM | POA: Diagnosis not present

## 2018-03-28 DIAGNOSIS — M9903 Segmental and somatic dysfunction of lumbar region: Secondary | ICD-10-CM | POA: Diagnosis not present

## 2018-03-28 DIAGNOSIS — M5442 Lumbago with sciatica, left side: Secondary | ICD-10-CM | POA: Diagnosis not present

## 2018-03-28 DIAGNOSIS — M545 Low back pain: Secondary | ICD-10-CM | POA: Diagnosis not present

## 2018-04-10 ENCOUNTER — Ambulatory Visit: Payer: 59 | Admitting: Psychology

## 2018-04-10 DIAGNOSIS — F411 Generalized anxiety disorder: Secondary | ICD-10-CM

## 2018-04-11 DIAGNOSIS — M9903 Segmental and somatic dysfunction of lumbar region: Secondary | ICD-10-CM | POA: Diagnosis not present

## 2018-04-11 DIAGNOSIS — M5442 Lumbago with sciatica, left side: Secondary | ICD-10-CM | POA: Diagnosis not present

## 2018-04-11 DIAGNOSIS — M545 Low back pain: Secondary | ICD-10-CM | POA: Diagnosis not present

## 2018-04-22 DIAGNOSIS — M5442 Lumbago with sciatica, left side: Secondary | ICD-10-CM | POA: Diagnosis not present

## 2018-04-22 DIAGNOSIS — M9903 Segmental and somatic dysfunction of lumbar region: Secondary | ICD-10-CM | POA: Diagnosis not present

## 2018-04-22 DIAGNOSIS — M545 Low back pain: Secondary | ICD-10-CM | POA: Diagnosis not present

## 2018-05-29 ENCOUNTER — Encounter: Payer: Self-pay | Admitting: Internal Medicine

## 2018-05-29 ENCOUNTER — Ambulatory Visit (INDEPENDENT_AMBULATORY_CARE_PROVIDER_SITE_OTHER): Payer: 59 | Admitting: Internal Medicine

## 2018-05-29 VITALS — BP 122/80 | HR 56 | Temp 97.7°F | Ht 70.0 in | Wt 212.6 lb

## 2018-05-29 DIAGNOSIS — E559 Vitamin D deficiency, unspecified: Secondary | ICD-10-CM | POA: Diagnosis not present

## 2018-05-29 DIAGNOSIS — Z0184 Encounter for antibody response examination: Secondary | ICD-10-CM

## 2018-05-29 DIAGNOSIS — E785 Hyperlipidemia, unspecified: Secondary | ICD-10-CM

## 2018-05-29 DIAGNOSIS — F419 Anxiety disorder, unspecified: Secondary | ICD-10-CM | POA: Diagnosis not present

## 2018-05-29 DIAGNOSIS — Z1389 Encounter for screening for other disorder: Secondary | ICD-10-CM

## 2018-05-29 DIAGNOSIS — Z23 Encounter for immunization: Secondary | ICD-10-CM | POA: Diagnosis not present

## 2018-05-29 DIAGNOSIS — L309 Dermatitis, unspecified: Secondary | ICD-10-CM | POA: Diagnosis not present

## 2018-05-29 DIAGNOSIS — Z1159 Encounter for screening for other viral diseases: Secondary | ICD-10-CM

## 2018-05-29 DIAGNOSIS — Z1329 Encounter for screening for other suspected endocrine disorder: Secondary | ICD-10-CM

## 2018-05-29 DIAGNOSIS — Z0000 Encounter for general adult medical examination without abnormal findings: Secondary | ICD-10-CM

## 2018-05-29 MED ORDER — TRIAMCINOLONE ACETONIDE 0.1 % EX CREA: 1.0000 "application " | TOPICAL_CREAM | Freq: Two times a day (BID) | CUTANEOUS | 0 refills | Status: DC

## 2018-05-29 NOTE — Patient Instructions (Addendum)
L theanine ([employer] foods/[employer]) or melatonin 5 mg 1 hour before bed   D3 5000 IU daily otc  Consider new hep B vaccine x 2 doses  Let me know about Trazadone if you want it for sleep in the future   Cholesterol Cholesterol is a white, waxy, fat-like substance that is needed by the human body in small amounts. The liver makes all the cholesterol we need. Cholesterol is carried from the liver by the blood through the blood vessels. Deposits of cholesterol (plaques) may build up on blood vessel (artery) walls. Plaques make the arteries narrower and stiffer. Cholesterol plaques increase the risk for heart attack and stroke. You cannot feel your cholesterol level even if it is very high. The only way to know that it is high is to have a blood test. Once you know your cholesterol levels, you should keep a record of the test results. Work with your health care provider to keep your levels in the desired range. What do the results mean?  Total cholesterol is a rough measure of all the cholesterol in your blood.  LDL (low-density lipoprotein) is the "bad" cholesterol. This is the type that causes plaque to build up on the artery walls. You want this level to be low.  HDL (high-density lipoprotein) is the "good" cholesterol because it cleans the arteries and carries the LDL away. You want this level to be high.  Triglycerides are fat that the body can either burn for energy or store. High levels are closely linked to heart disease. What are the desired levels of cholesterol?  Total cholesterol below 200.  LDL below 100 for people who are at risk, below 70 for people at very high risk.  HDL above 40 is good. A level of 60 or higher is considered to be protective against heart disease.  Triglycerides below 150. How can I lower my cholesterol? Diet Follow your diet program as told by your health care provider.  Choose fish or white meat chicken and [place], roasted or baked. Limit fatty  cuts of red meat, fried foods, and processed meats, such as sausage and lunch meats.  Eat lots of fresh fruits and vegetables.  Choose whole grains, beans, pasta, potatoes, and cereals.  Choose olive oil, corn oil, or canola oil, and use only small amounts.  Avoid butter, mayonnaise, shortening, or palm kernel oils.  Avoid foods with trans fats.  Drink skim or nonfat milk and eat low-fat or nonfat yogurt and cheeses. Avoid whole milk, cream, ice cream, egg yolks, and full-fat cheeses.  Healthier desserts include angel food cake, ginger snaps, animal crackers, hard candy, popsicles, and low-fat or nonfat frozen yogurt. Avoid pastries, cakes, pies, and cookies.  Exercise  Follow your exercise program as told by your health care provider. A regular program: ? Helps to decrease LDL and raise HDL. ? Helps with weight control.  Do things that increase your activity level, such as gardening, walking, and taking the stairs.  Ask your health care provider about ways that you can be more active in your daily life. Medicine  Take over-the-counter and prescription medicines only as told by your health care provider. ? Medicine may be prescribed by your health care provider to help lower cholesterol and decrease the risk for heart disease. This is usually done if diet and exercise have failed to bring down cholesterol levels. ? If you have several risk factors, you may need medicine even if your levels are normal. This information is  not intended to replace advice given to you by your health care provider. Make sure you discuss any questions you have with your health care provider. Document Released: 01/31/2001 Document Revised: 12/04/2015 Document Reviewed: 11/06/2015 Elsevier Interactive Patient Education  2019 Elsevier Inc.   Vitamin D Deficiency Vitamin D deficiency is when your body does not have enough vitamin D. Vitamin D is important to your body for many reasons:  It helps the body  to absorb two important minerals, called calcium and phosphorus.  It plays a role in bone health.  It may help to prevent some diseases, such as diabetes and multiple sclerosis.  It plays a role in muscle function, including heart function. You can get vitamin D by:  Eating foods that naturally contain vitamin D.  Eating or drinking milk or other dairy products that have vitamin D added to them.  Taking a vitamin D supplement or a multivitamin supplement that contains vitamin D.  Being in the sun. Your body naturally makes vitamin D when your skin is exposed to sunlight. Your body changes the sunlight into a form of the vitamin that the body can use. If vitamin D deficiency is severe, it can cause a condition in which your bones become soft. In adults, this condition is called osteomalacia. In children, this condition is called rickets. What are the causes? Vitamin D deficiency may be caused by:  Not eating enough foods that contain vitamin D.  Not getting enough sun exposure.  Having certain digestive system diseases that make it difficult for your body to absorb vitamin D. These diseases include Crohn disease, chronic pancreatitis, and cystic fibrosis.  Having a surgery in which a part of the stomach or a part of the small intestine is removed.  Being obese.  Having chronic kidney disease or liver disease. What increases the risk? This condition is more likely to develop in:  Older people.  People who do not spend much time outdoors.  People who live in a long-term care facility.  People who have had broken bones.  People with weak or thin bones (osteoporosis).  People who have a disease or condition that changes how the body absorbs vitamin D.  People who have dark skin.  People who take certain medicines, such as steroid medicines or certain seizure medicines.  People who are overweight or obese. What are the signs or symptoms? In mild cases of vitamin D  deficiency, there may not be any symptoms. If the condition is severe, symptoms may include:  Bone pain.  Muscle pain.  Falling often.  Broken bones caused by a minor injury. How is this diagnosed? This condition is usually diagnosed with a blood test. How is this treated? Treatment for this condition may depend on what caused the condition. Treatment options include:  Taking vitamin D supplements.  Taking a calcium supplement. Your health care provider will suggest what dose is best for you. Follow these instructions at home:  Take medicines and supplements only as told by your health care provider.  Eat foods that contain vitamin D. Choices include: ? Fortified dairy products, cereals, or juices. Fortified means that vitamin D has been added to the food. Check the label on the package to be sure. ? Fatty fish, such as salmon or trout. ? Eggs. ? Oysters.  Do not use a tanning bed.  Maintain a healthy weight. Lose weight, if needed.  Keep all follow-up visits as told by your health care provider. This is important. Contact a  health care provider if:  Your symptoms do not go away.  You feel like throwing up (nausea) or you throw up (vomit).  You have fewer bowel movements than usual or it is difficult for you to have a bowel movement (constipation). This information is not intended to replace advice given to you by your health care provider. Make sure you discuss any questions you have with your health care provider. Document Released: 07/31/2011 Document Revised: 10/20/2015 Document Reviewed: 09/23/2014 Elsevier Interactive Patient Education  2019 Elsevier Inc.   Trazodone tablets What is this medicine? TRAZODONE (TRAZ oh done) is used to treat depression. This medicine may be used for other purposes; ask your health care provider or pharmacist if you have questions. COMMON BRAND NAME(S): Desyrel What should I tell my health care provider before I take this  medicine? They need to know if you have any of these conditions: -attempted suicide or thinking about it -bipolar disorder -bleeding problems -glaucoma -heart disease, or previous heart attack -irregular heart beat -kidney or liver disease -low levels of sodium in the blood -an unusual or allergic reaction to trazodone, other medicines, foods, dyes or preservatives -pregnant or trying to get pregnant -breast-feeding How should I use this medicine? Take this medicine by mouth with a glass of water. Follow the directions on the prescription label. Take this medicine shortly after a meal or a light snack. Take your medicine at regular intervals. Do not take your medicine more often than directed. Do not stop taking this medicine suddenly except upon the advice of your doctor. Stopping this medicine too quickly may cause serious side effects or your condition may worsen. A special MedGuide will be given to you by the pharmacist with each prescription and refill. Be sure to read this information carefully each time. Talk to your pediatrician regarding the use of this medicine in children. Special care may be needed. Overdosage: If you think you have taken too much of this medicine contact a poison control center or emergency room at once. NOTE: This medicine is only for you. Do not share this medicine with others. What if I miss a dose? If you miss a dose, take it as soon as you can. If it is almost time for your next dose, take only that dose. Do not take double or extra doses. What may interact with this medicine? Do not take this medicine with any of the following medications: -certain medicines for fungal infections like fluconazole, itraconazole, ketoconazole, posaconazole, voriconazole -cisapride -dofetilide -dronedarone -linezolid -MAOIs like Carbex, Eldepryl, Marplan, Nardil, and Parnate -mesoridazine -methylene blue (injected into a vein) -pimozide -saquinavir -thioridazine This  medicine may also interact with the following medications: -alcohol -antiviral medicines for HIV or AIDS -aspirin and aspirin-like medicines -barbiturates like phenobarbital -certain medicines for blood pressure, heart disease, irregular heart beat -certain medicines for depression, anxiety, or psychotic disturbances -certain medicines for migraine headache like almotriptan, eletriptan, frovatriptan, naratriptan, rizatriptan, sumatriptan, zolmitriptan -certain medicines for seizures like carbamazepine and phenytoin -certain medicines for sleep -certain medicines that treat or prevent blood clots like dalteparin, enoxaparin, warfarin -digoxin -fentanyl -lithium -NSAIDS, medicines for pain and inflammation, like ibuprofen or naproxen -other medicines that prolong the QT interval (cause an abnormal heart rhythm) -rasagiline -supplements like St. John's wort, kava kava, valerian -tramadol -tryptophan This list may not describe all possible interactions. Give your health care provider a list of all the medicines, herbs, non-prescription drugs, or dietary supplements you use. Also tell them if you smoke, drink alcohol, or  use illegal drugs. Some items may interact with your medicine. What should I watch for while using this medicine? Tell your doctor if your symptoms do not get better or if they get worse. Visit your doctor or health care professional for regular checks on your progress. Because it may take several weeks to see the full effects of this medicine, it is important to continue your treatment as prescribed by your doctor. Patients and their families should watch out for new or worsening thoughts of suicide or depression. Also watch out for sudden changes in feelings such as feeling anxious, agitated, panicky, irritable, hostile, aggressive, impulsive, severely restless, overly excited and hyperactive, or not being able to sleep. If this happens, especially at the beginning of treatment  or after a change in dose, call your health care professional. Bonita QuinYou may get drowsy or dizzy. Do not drive, use machinery, or do anything that needs mental alertness until you know how this medicine affects you. Do not stand or sit up quickly, especially if you are an older patient. This reduces the risk of dizzy or fainting spells. Alcohol may interfere with the effect of this medicine. Avoid alcoholic drinks. This medicine may cause dry eyes and blurred vision. If you wear contact lenses you may feel some discomfort. Lubricating drops may help. See your eye doctor if the problem does not go away or is severe. Your mouth may get dry. Chewing sugarless gum, sucking hard candy and drinking plenty of water may help. Contact your doctor if the problem does not go away or is severe. What side effects may I notice from receiving this medicine? Side effects that you should report to your doctor or health care professional as soon as possible: -allergic reactions like skin rash, itching or hives, swelling of the face, lips, or tongue -elevated mood, decreased need for sleep, racing thoughts, impulsive behavior -confusion -fast, irregular heartbeat -feeling faint or lightheaded, falls -feeling agitated, angry, or irritable -loss of balance or coordination -painful or prolonged erections -restlessness, pacing, inability to keep still -suicidal thoughts or other mood changes -tremors -trouble sleeping -seizures -unusual bleeding or bruising Side effects that usually do not require medical attention (report to your doctor or health care professional if they continue or are bothersome): -change in sex drive or performance -change in appetite or weight -constipation -headache -muscle aches or pains -nausea This list may not describe all possible side effects. Call your doctor for medical advice about side effects. You may report side effects to FDA at 1-800-FDA-1088. Where should I keep my  medicine? Keep out of the reach of children. Store at room temperature between 15 and 30 degrees C (59 to 86 degrees F). Protect from light. Keep container tightly closed. Throw away any unused medicine after the expiration date. NOTE: This sheet is a summary. It may not cover all possible information. If you have questions about this medicine, talk to your doctor, pharmacist, or health care provider.  2019 Elsevier/Gold Standard (2017-07-17 17:51:24)   Hepatitis B Vaccine, Recombinant injection What is this medicine? HEPATITIS B VACCINE (hep uh TAHY tis B VAK seen) is a vaccine. It is used to prevent an infection with the hepatitis B virus. This medicine may be used for other purposes; ask your health care provider or pharmacist if you have questions. COMMON BRAND NAME(S): Engerix-B, Recombivax HB What should I tell my health care provider before I take this medicine? They need to know if you have any of these conditions: -fever, infection -heart  disease -hepatitis B infection -immune system problems -kidney disease -an unusual or allergic reaction to vaccines, yeast, other medicines, foods, dyes, or preservatives -pregnant or trying to get pregnant -breast-feeding How should I use this medicine? This vaccine is for injection into a muscle. It is given by a health care professional. A copy of Vaccine Information Statements will be given before each vaccination. Read this sheet carefully each time. The sheet may change frequently. Talk to your pediatrician regarding the use of this medicine in children. While this drug may be prescribed for children as young as newborn for selected conditions, precautions do apply. Overdosage: If you think you have taken too much of this medicine contact a poison control center or emergency room at once. NOTE: This medicine is only for you. Do not share this medicine with others. What if I miss a dose? It is important not to miss your dose. Call your  doctor or health care professional if you are unable to keep an appointment. What may interact with this medicine? -medicines that suppress your immune function like adalimumab, anakinra, infliximab -medicines to treat cancer -steroid medicines like prednisone or cortisone This list may not describe all possible interactions. Give your health care provider a list of all the medicines, herbs, non-prescription drugs, or dietary supplements you use. Also tell them if you smoke, drink alcohol, or use illegal drugs. Some items may interact with your medicine. What should I watch for while using this medicine? See your health care provider for all shots of this vaccine as directed. You must have 3 shots of this vaccine for protection from hepatitis B infection. Tell your doctor right away if you have any serious or unusual side effects after getting this vaccine. What side effects may I notice from receiving this medicine? Side effects that you should report to your doctor or health care professional as soon as possible: -allergic reactions like skin rash, itching or hives, swelling of the face, lips, or tongue -breathing problems -confused, irritated -fast, irregular heartbeat -flu-like syndrome -numb, tingling pain -seizures -unusually weak or tired Side effects that usually do not require medical attention (report to your doctor or health care professional if they continue or are bothersome): -diarrhea -fever -headache -loss of appetite -muscle pain -nausea -pain, redness, swelling, or irritation at site where injected -tiredness This list may not describe all possible side effects. Call your doctor for medical advice about side effects. You may report side effects to FDA at 1-800-FDA-1088. Where should I keep my medicine? This drug is given in a hospital or clinic and will not be stored at home. NOTE: This sheet is a summary. It may not cover all possible information. If you have  questions about this medicine, talk to your doctor, pharmacist, or health care provider.  2019 Elsevier/Gold Standard (2013-09-08 13:26:01)

## 2018-05-29 NOTE — Progress Notes (Signed)
Pre visit review using our clinic review tool, if applicable. No additional management support is needed unless otherwise documented below in the visit note. 

## 2018-05-29 NOTE — Progress Notes (Signed)
Chief Complaint  Patient presents with  . Follow-up   F/u  1. HLD with FH HLD he is taking otc fish oil and changed his diet  2. Need hep B vx 3. Left shoulder no issues but if some wants to see [facility] in future  4. Anxiety/insomnia anxiety causing insomnia seeing therapist 1.5 years and started prozac 10 mg qd again he has trouble staying alseep.  5. C/o lesion right forearm he is itching and picking x 6 weeks. Nothing tried   Review of Systems  Constitutional: Negative for weight loss.  HENT: Negative for hearing loss.   Eyes: Negative for blurred vision.  Respiratory: Negative for shortness of breath.   Cardiovascular: Negative for chest pain.  Gastrointestinal: Negative for abdominal pain.  Musculoskeletal: Negative for joint pain.  Skin: Positive for itching.  Neurological: Negative for headaches.  Psychiatric/Behavioral: Negative for depression. The patient is nervous/anxious and has insomnia.    Past Medical History:  Diagnosis Date  . Allergy   . Chicken pox   . Shoulder pain    Past Surgical History:  Procedure Laterality Date  . ROTATOR CUFF REPAIR  2015  . VASECTOMY  2014   Family History  Problem Relation Age of Onset  . Hypertension Mother   . Hyperlipidemia Father   . Hyperlipidemia Maternal Grandmother   . Hypertension Maternal Grandmother   . Arthritis Maternal Grandmother    Social History   Socioeconomic History  . Marital status: Single    Spouse name: Not on file  . Number of children: Not on file  . Years of education: Not on file  . Highest education level: Not on file  Occupational History  . Not on file  Social Needs  . Financial resource strain: Not on file  . Food insecurity:    Worry: Not on file    Inability: Not on file  . Transportation needs:    Medical: Not on file    Non-medical: Not on file  Tobacco Use  . Smoking status: Never Smoker  . Smokeless tobacco: Never Used  Substance and Sexual Activity  . Alcohol use:  Yes    Alcohol/week: 10.0 standard drinks    Types: 10 Cans of beer per week  . Drug use: Yes    Comment: acid- teenager  . Sexual activity: Yes    Birth control/protection: None  Lifestyle  . Physical activity:    Days per week: Not on file    Minutes per session: Not on file  . Stress: Not on file  Relationships  . Social connections:    Talks on phone: Not on file    Gets together: Not on file    Attends religious service: Not on file    Active member of club or organization: Not on file    Attends meetings of clubs or organizations: Not on file    Relationship status: Not on file  . Intimate partner violence:    Fear of current or ex partner: Not on file    Emotionally abused: Not on file    Physically abused: Not on file    Forced sexual activity: Not on file  Other Topics Concern  . Not on file  Social History Narrative   Married    Surveyor, mineralsContractor    Current Meds  Medication Sig  . FLUoxetine (PROZAC) 10 MG tablet Take 10 mg by mouth daily.   Marland Kitchen. loratadine (CLARITIN) 10 MG tablet Take 10 mg by mouth daily.  Marland Kitchen. LORazepam (  ATIVAN) 0.5 MG tablet   . meclizine (ANTIVERT) 25 MG tablet Take 1 tablet (25 mg total) by mouth 3 (three) times daily as needed for dizziness.  . Multiple Vitamin (MULTI-VITAMINS) TABS Take by mouth.  . Omega-3 Fatty Acids (FISH OIL PO) Take by mouth.  . valACYclovir (VALTREX) 1000 MG tablet as needed.    No Known Allergies No results found for this or any previous visit (from the past 2160 hour(s)). Objective  Body mass index is 30.5 kg/m. Wt Readings from Last 3 Encounters:  05/29/18 212 lb 9.6 oz (96.4 kg)  12/11/17 214 lb 6 oz (97.2 kg)  11/26/17 213 lb (96.6 kg)   Temp Readings from Last 3 Encounters:  05/29/18 97.7 F (36.5 C) (Oral)  12/11/17 98.6 F (37 C) (Oral)  11/26/17 99.1 F (37.3 C) (Oral)   BP Readings from Last 3 Encounters:  05/29/18 122/80  12/11/17 114/74  11/26/17 128/84   Pulse Readings from Last 3 Encounters:   05/29/18 (!) 56  12/11/17 (!) 56  11/26/17 71    Physical Exam Vitals signs and nursing note reviewed.  Constitutional:      Appearance: Normal appearance. He is well-developed. He is obese.  HENT:     Head: Normocephalic and atraumatic.     Nose: Nose normal.     Mouth/Throat:     Mouth: Mucous membranes are moist.     Pharynx: Oropharynx is clear.  Eyes:     Conjunctiva/sclera: Conjunctivae normal.     Pupils: Pupils are equal, round, and reactive to light.  Cardiovascular:     Rate and Rhythm: Regular rhythm. Bradycardia present.     Heart sounds: Normal heart sounds.  Pulmonary:     Effort: Pulmonary effort is normal.     Breath sounds: Normal breath sounds.  Skin:    General: Skin is warm and dry.     Comments: Nodule 0.5 cm right forearm pruritic ? Prurigo nodule   Neurological:     General: No focal deficit present.     Mental Status: He is alert and oriented to person, place, and time.     Gait: Gait normal.  Psychiatric:        Attention and Perception: Attention and perception normal.        Mood and Affect: Mood and affect normal.        Speech: Speech normal.        Behavior: Behavior normal. Behavior is cooperative.        Thought Content: Thought content normal.        Cognition and Memory: Cognition and memory normal.        Judgment: Judgment normal.     Assessment   1. HLD 2. Vit D def  3. Anxiety/insomnia  4. Left shoulder abnormal Xray 09/2017  5. Dermatitis vs PN right forearm  6. HM Plan   1.  Labs fasting sch today  Given cholesterol handout  2. rec D3 5k iu daily otc  3. Disc melatonin/L theanine call back if wants trazadone  Resumed prozac 20 mg qd  4. If flares wants referral [facility] ok for now  5. T[facility] cream x 2 weeks bid call back if not resolved for derm referral  6.  Cont exercise to 2x per week healthy diet choices  No need for dermatology now benign nevi Never flu  UTD Tdap  New hep B vx today  Check fasting labs  upcoming  Provider: Dr. French Ana McLean-Scocuzza-[facility]

## 2018-06-12 ENCOUNTER — Ambulatory Visit: Payer: 59 | Admitting: Psychology

## 2018-06-12 DIAGNOSIS — F411 Generalized anxiety disorder: Secondary | ICD-10-CM

## 2018-07-02 ENCOUNTER — Ambulatory Visit (INDEPENDENT_AMBULATORY_CARE_PROVIDER_SITE_OTHER): Payer: 59

## 2018-07-02 DIAGNOSIS — Z23 Encounter for immunization: Secondary | ICD-10-CM

## 2018-07-02 NOTE — Progress Notes (Signed)
Patient presented for HEP B injection to left deltoid 2/2, patient voiced no concerns nor showed any signs of distress during injection.

## 2018-07-02 NOTE — Addendum Note (Signed)
Addended by: Elise Benne T on: 07/02/2018 08:57 AM   Modules accepted: Orders

## 2018-08-14 ENCOUNTER — Other Ambulatory Visit: Payer: Self-pay

## 2018-08-14 ENCOUNTER — Ambulatory Visit: Payer: 59 | Admitting: Psychology

## 2018-08-14 DIAGNOSIS — F411 Generalized anxiety disorder: Secondary | ICD-10-CM

## 2018-09-23 ENCOUNTER — Other Ambulatory Visit: Payer: Self-pay

## 2018-09-23 ENCOUNTER — Other Ambulatory Visit (INDEPENDENT_AMBULATORY_CARE_PROVIDER_SITE_OTHER): Payer: 59

## 2018-09-23 DIAGNOSIS — Z1389 Encounter for screening for other disorder: Secondary | ICD-10-CM | POA: Diagnosis not present

## 2018-09-23 DIAGNOSIS — E559 Vitamin D deficiency, unspecified: Secondary | ICD-10-CM

## 2018-09-23 DIAGNOSIS — Z1329 Encounter for screening for other suspected endocrine disorder: Secondary | ICD-10-CM | POA: Diagnosis not present

## 2018-09-23 DIAGNOSIS — Z0000 Encounter for general adult medical examination without abnormal findings: Secondary | ICD-10-CM | POA: Diagnosis not present

## 2018-09-23 DIAGNOSIS — Z0184 Encounter for antibody response examination: Secondary | ICD-10-CM | POA: Diagnosis not present

## 2018-09-23 DIAGNOSIS — Z1159 Encounter for screening for other viral diseases: Secondary | ICD-10-CM

## 2018-09-23 DIAGNOSIS — E785 Hyperlipidemia, unspecified: Secondary | ICD-10-CM | POA: Diagnosis not present

## 2018-09-23 LAB — LIPID PANEL
Cholesterol: 227 mg/dL — ABNORMAL HIGH (ref 0–200)
HDL: 37.7 mg/dL — ABNORMAL LOW (ref >39.00)
NonHDL: 189.04
Total CHOL/HDL Ratio: 6
Triglycerides: 372 mg/dL — ABNORMAL HIGH (ref 0.0–149.0)
VLDL: 74.4 mg/dL — ABNORMAL HIGH (ref 0.0–40.0)

## 2018-09-23 LAB — CBC WITH DIFFERENTIAL/PLATELET
Basophils Absolute: 0 10*3/uL (ref 0.0–0.1)
Basophils Relative: 0.8 % (ref 0.0–3.0)
Eosinophils Absolute: 0.2 10*3/uL (ref 0.0–0.7)
Eosinophils Relative: 4.5 % (ref 0.0–5.0)
HCT: 44.6 % (ref 39.0–52.0)
Hemoglobin: 15.9 g/dL (ref 13.0–17.0)
Lymphocytes Relative: 45.9 % (ref 12.0–46.0)
Lymphs Abs: 2.1 10*3/uL (ref 0.7–4.0)
MCHC: 35.6 g/dL (ref 30.0–36.0)
MCV: 94.9 fl (ref 78.0–100.0)
Monocytes Absolute: 0.6 10*3/uL (ref 0.1–1.0)
Monocytes Relative: 12.5 % — ABNORMAL HIGH (ref 3.0–12.0)
Neutro Abs: 1.7 10*3/uL (ref 1.4–7.7)
Neutrophils Relative %: 36.3 % — ABNORMAL LOW (ref 43.0–77.0)
Platelets: 233 10*3/uL (ref 150.0–400.0)
RBC: 4.7 Mil/uL (ref 4.22–5.81)
RDW: 12.5 % (ref 11.5–15.5)
WBC: 4.6 10*3/uL (ref 4.0–10.5)

## 2018-09-23 LAB — COMPREHENSIVE METABOLIC PANEL
ALT: 40 U/L (ref 0–53)
AST: 25 U/L (ref 0–37)
Albumin: 4.5 g/dL (ref 3.5–5.2)
Alkaline Phosphatase: 80 U/L (ref 39–117)
BUN: 14 mg/dL (ref 6–23)
CO2: 28 mEq/L (ref 19–32)
Calcium: 9 mg/dL (ref 8.4–10.5)
Chloride: 102 mEq/L (ref 96–112)
Creatinine, Ser: 1.08 mg/dL (ref 0.40–1.50)
GFR: 73.97 mL/min (ref >60.00)
Glucose, Bld: 98 mg/dL (ref 70–99)
Potassium: 4.6 mEq/L (ref 3.5–5.1)
Sodium: 138 mEq/L (ref 135–145)
Total Bilirubin: 0.5 mg/dL (ref 0.2–1.2)
Total Protein: 6.9 g/dL (ref 6.0–8.3)

## 2018-09-23 LAB — LDL CHOLESTEROL, DIRECT: Direct LDL: 107 mg/dL

## 2018-09-23 LAB — TSH: TSH: 1.33 u[IU]/mL (ref 0.35–4.50)

## 2018-09-23 LAB — VITAMIN D 25 HYDROXY (VIT D DEFICIENCY, FRACTURES): VITD: 44.22 ng/mL (ref 30.00–100.00)

## 2018-09-24 LAB — MEASLES/MUMPS/RUBELLA IMMUNITY
Mumps IgG: 148 AU/mL
Rubella: 10.4 index
Rubeola IgG: 75.1 AU/mL

## 2018-09-24 LAB — URINALYSIS, ROUTINE W REFLEX MICROSCOPIC
Bilirubin Urine: NEGATIVE
Glucose, UA: NEGATIVE
Hgb urine dipstick: NEGATIVE
Ketones, ur: NEGATIVE
Leukocytes,Ua: NEGATIVE
Nitrite: NEGATIVE
Protein, ur: NEGATIVE
Specific Gravity, Urine: 1.018 (ref 1.001–1.03)
pH: <=5 (ref 5.0–8.0)

## 2018-11-06 ENCOUNTER — Ambulatory Visit (INDEPENDENT_AMBULATORY_CARE_PROVIDER_SITE_OTHER): Payer: 59 | Admitting: Psychology

## 2018-11-06 ENCOUNTER — Other Ambulatory Visit: Payer: Self-pay

## 2018-11-06 DIAGNOSIS — F411 Generalized anxiety disorder: Secondary | ICD-10-CM

## 2018-11-27 ENCOUNTER — Other Ambulatory Visit: Payer: Self-pay

## 2018-11-27 ENCOUNTER — Ambulatory Visit (INDEPENDENT_AMBULATORY_CARE_PROVIDER_SITE_OTHER): Payer: 59 | Admitting: Internal Medicine

## 2018-11-27 DIAGNOSIS — E785 Hyperlipidemia, unspecified: Secondary | ICD-10-CM

## 2018-11-27 DIAGNOSIS — G4700 Insomnia, unspecified: Secondary | ICD-10-CM

## 2018-11-27 DIAGNOSIS — F329 Major depressive disorder, single episode, unspecified: Secondary | ICD-10-CM | POA: Diagnosis not present

## 2018-11-27 DIAGNOSIS — Z0000 Encounter for general adult medical examination without abnormal findings: Secondary | ICD-10-CM

## 2018-11-27 DIAGNOSIS — F419 Anxiety disorder, unspecified: Secondary | ICD-10-CM

## 2018-11-27 DIAGNOSIS — F32A Depression, unspecified: Secondary | ICD-10-CM

## 2018-11-27 MED ORDER — OMEGA-3-ACID ETHYL ESTERS 1 G PO CAPS: 2.0000 g | ORAL_CAPSULE | Freq: Two times a day (BID) | ORAL | 3 refills | Status: DC

## 2018-11-27 MED ORDER — FLUOXETINE HCL 10 MG PO TABS: 10.0000 mg | ORAL_TABLET | Freq: Every day | ORAL | 3 refills | Status: DC

## 2018-11-27 MED ORDER — TRAZODONE HCL 50 MG PO TABS: 25.0000 mg | ORAL_TABLET | Freq: Every evening | ORAL | 2 refills | Status: DC | PRN

## 2018-11-27 NOTE — Progress Notes (Signed)
Virtual Visit via Video Note  I connected with Kevin [name]   on 11/27/18 at  8:10 AM EDT by a video enabled telemedicine application and verified that I am speaking with the correct person using two identifiers.  Location patient: home Location provider:work  Persons participating in the virtual visit: patient, provider  I discussed the limitations of evaluation and management by telemedicine and the availability of in person appointments. The patient expressed understanding and agreed to proceed.   HPI: 1. Annual reviewed labs 09/2018 HLD, vit D def he is taking D3 5000 IU qd  2. HLD with FH HLD and high trigs he is taking otc fish oil and will start exercise and eating right reports wt gain os lbs since pandemic  3. Anxiety/depression insomnia prozac 10 mg is working and he wakes up at times 1-1:30 am due to thinking and has trouble falling back to sleep     ROS: See pertinent positives and negatives per HPI. General: weight gain  HEENT: no sore throal CV: no chest pain  Lungs: no sob  GI: no ab pain  MSK: shoulders ok  Neuro: no h/o Psych: anxiety/depression controlled +insomnia  Skin: no issues  GU: no issues   Past Medical History:  Diagnosis Date  . Allergy   . Anxiety   . Chicken pox   . Insomnia   . Shoulder pain   . Vitamin D deficiency     Past Surgical History:  Procedure Laterality Date  . ROTATOR CUFF REPAIR  2015  . VASECTOMY  2014    Family History  Problem Relation Age of Onset  . Hypertension Mother   . Hyperlipidemia Father   . Hyperlipidemia Maternal Grandmother   . Hypertension Maternal Grandmother   . Arthritis Maternal Grandmother     SOCIAL HX: married    Current Outpatient Medications:  .  FLUoxetine (PROZAC) 10 MG tablet, Take 1 tablet (10 mg total) by mouth daily., Disp: 90 tablet, Rfl: 3 .  loratadine (CLARITIN) 10 MG tablet, Take 10 mg by mouth daily., Disp: , Rfl:  .  LORazepam (ATIVAN) 0.5 MG tablet, , Disp: , Rfl:  .   meclizine (ANTIVERT) 25 MG tablet, Take 1 tablet (25 mg total) by mouth 3 (three) times daily as needed for dizziness., Disp: 30 tablet, Rfl: 0 .  Multiple Vitamin (MULTI-VITAMINS) TABS, Take by mouth., Disp: , Rfl:  .  triamcinolone cream (KENALOG) 0.1 %, Apply 1 application topically 2 (two) times daily. Right arm, Disp: 30 g, Rfl: 0 .  valACYclovir (VALTREX) 1000 MG tablet, as needed. , Disp: , Rfl: 1 .  omega-3 acid ethyl esters (LOVAZA) 1 g capsule, Take 2 capsules (2 g total) by mouth 2 (two) times daily., Disp: 340 capsule, Rfl: 3 .  traZODone (DESYREL) 50 MG tablet, Take 0.5-1 tablets (25-50 mg total) by mouth at bedtime as needed for sleep., Disp: 30 tablet, Rfl: 2  EXAM:  VITALS per patient if applicable:  GENERAL: alert, oriented, appears well and in no acute distress  HEENT: atraumatic, conjunttiva clear, no obvious abnormalities on inspection of external nose and ears  NECK: normal movements of the head and neck  LUNGS: on inspection no signs of respiratory distress, breathing rate appears normal, no obvious gross SOB, gasping or wheezing  CV: no obvious cyanosis  MS: moves all visible extremities without noticeable abnormality  PSYCH/NEURO: pleasant and cooperative, no obvious depression or anxiety, speech and thought processing grossly intact  ASSESSMENT AND PLAN:  Discussed the following  assessment and plan:  Annual physical exam - Plan:   Cont exercise to 2x per week healthy diet choices  No need for dermatology now benign nevi Never flu  UTD Tdap  MMR immune Hep B 2/2 need to check titer in future   Hyperlipidemia, unspecified hyperlipidemia type - Plan: omega-3 acid ethyl esters (LOVAZA) 2 g capsule bid  Check lipid in 4 months   Insomnia, unspecified type - Plan: traZODone (DESYREL) 25-50 MG tablet qhs prn  Depression, unspecified depression type - Plan: FLUoxetine (PROZAC) 10 MG tablet  Anxiety - Plan: FLUoxetine (PROZAC) 10 MG tablet    I  discussed the assessment and treatment plan with the patient. The patient was provided an opportunity to ask questions and all were answered. The patient agreed with the plan and demonstrated an understanding of the instructions.   The patient was advised to call back or seek an in-person evaluation if the symptoms worsen or if the condition fails to improve as anticipated.  Time spent 25 minutes  Delorise Jackson, MD

## 2018-12-03 ENCOUNTER — Encounter: Payer: Self-pay | Admitting: Internal Medicine

## 2018-12-03 ENCOUNTER — Telehealth: Payer: Self-pay | Admitting: Internal Medicine

## 2018-12-03 NOTE — Telephone Encounter (Signed)
Call pharmacy [facility] not approved by insurance and pt wants to try this option needs appeal faxed to this office   [place]

## 2018-12-22 ENCOUNTER — Other Ambulatory Visit: Payer: Self-pay | Admitting: Internal Medicine

## 2018-12-22 DIAGNOSIS — G4700 Insomnia, unspecified: Secondary | ICD-10-CM

## 2018-12-26 ENCOUNTER — Ambulatory Visit (INDEPENDENT_AMBULATORY_CARE_PROVIDER_SITE_OTHER): Payer: 59 | Admitting: Psychology

## 2018-12-26 DIAGNOSIS — F411 Generalized anxiety disorder: Secondary | ICD-10-CM

## 2019-03-19 ENCOUNTER — Ambulatory Visit (INDEPENDENT_AMBULATORY_CARE_PROVIDER_SITE_OTHER): Payer: 59 | Admitting: Psychology

## 2019-03-19 DIAGNOSIS — F411 Generalized anxiety disorder: Secondary | ICD-10-CM

## 2019-03-31 ENCOUNTER — Other Ambulatory Visit: Payer: Self-pay

## 2019-03-31 DIAGNOSIS — Z20822 Contact with and (suspected) exposure to covid-19: Secondary | ICD-10-CM

## 2019-04-01 ENCOUNTER — Other Ambulatory Visit: Payer: 59

## 2019-04-03 LAB — NOVEL CORONAVIRUS, NAA: SARS-CoV-2, NAA: NOT DETECTED

## 2019-04-04 ENCOUNTER — Ambulatory Visit: Payer: 59 | Admitting: Internal Medicine

## 2019-04-25 ENCOUNTER — Other Ambulatory Visit: Payer: Self-pay

## 2019-04-28 ENCOUNTER — Other Ambulatory Visit: Payer: Self-pay

## 2019-04-28 ENCOUNTER — Other Ambulatory Visit (INDEPENDENT_AMBULATORY_CARE_PROVIDER_SITE_OTHER): Payer: 59

## 2019-04-28 DIAGNOSIS — E785 Hyperlipidemia, unspecified: Secondary | ICD-10-CM

## 2019-04-28 LAB — LIPID PANEL
Cholesterol: 202 mg/dL — ABNORMAL HIGH (ref 0–200)
HDL: 40.6 mg/dL (ref >39.00)
NonHDL: 161.89
Total CHOL/HDL Ratio: 5
Triglycerides: 238 mg/dL — ABNORMAL HIGH (ref 0.0–149.0)
VLDL: 47.6 mg/dL — ABNORMAL HIGH (ref 0.0–40.0)

## 2019-04-28 LAB — LDL CHOLESTEROL, DIRECT: Direct LDL: 120 mg/dL

## 2019-05-02 ENCOUNTER — Ambulatory Visit (INDEPENDENT_AMBULATORY_CARE_PROVIDER_SITE_OTHER): Payer: 59 | Admitting: Internal Medicine

## 2019-05-02 ENCOUNTER — Encounter: Payer: Self-pay | Admitting: Internal Medicine

## 2019-05-02 ENCOUNTER — Other Ambulatory Visit: Payer: Self-pay

## 2019-05-02 VITALS — Ht 70.0 in | Wt 205.0 lb

## 2019-05-02 DIAGNOSIS — Z0000 Encounter for general adult medical examination without abnormal findings: Secondary | ICD-10-CM

## 2019-05-02 DIAGNOSIS — E785 Hyperlipidemia, unspecified: Secondary | ICD-10-CM

## 2019-05-02 DIAGNOSIS — Z1389 Encounter for screening for other disorder: Secondary | ICD-10-CM

## 2019-05-02 DIAGNOSIS — Z125 Encounter for screening for malignant neoplasm of prostate: Secondary | ICD-10-CM

## 2019-05-02 DIAGNOSIS — Z0184 Encounter for antibody response examination: Secondary | ICD-10-CM

## 2019-05-02 DIAGNOSIS — E559 Vitamin D deficiency, unspecified: Secondary | ICD-10-CM

## 2019-05-02 DIAGNOSIS — Z1159 Encounter for screening for other viral diseases: Secondary | ICD-10-CM

## 2019-05-02 DIAGNOSIS — Z1329 Encounter for screening for other suspected endocrine disorder: Secondary | ICD-10-CM

## 2019-05-02 NOTE — Progress Notes (Signed)
Virtual Visit via Video Note  I connected with Kevin [name]  on 05/02/19 at  3:30 PM EST by a video enabled telemedicine application and verified that I am speaking with the correct person using two identifiers.  Location patient:work  Location provider:work or home office Persons participating in the virtual visit: patient, provider  I discussed the limitations of evaluation and management by telemedicine and the availability of in person appointments. The patient expressed understanding and agreed to proceed.   HPI: 1. HLD f/u with improved TC and TGs but LDL worse from 107 to 120 he is trying to eat right and exercise more  We reviewed healthy diet choices today  2. 03/31/19 negative covid 19 test daughter exposed with gymnastics. Condolences as his uncle in [place] just died from [place] after attending shag dance session/competition  3. Left shoulder stiff and sticking at times able to do exercise w/o issues no significant pain  Declines ortho referral for now     ROS: See pertinent positives and negatives per HPI.  Past Medical History:  Diagnosis Date  . Allergy   . Anxiety   . Chicken pox   . Insomnia   . Shoulder pain   . Vitamin D deficiency     Past Surgical History:  Procedure Laterality Date  . ROTATOR CUFF REPAIR  2015  . VASECTOMY  2014    Family History  Problem Relation Age of Onset  . Hypertension Mother   . Hyperlipidemia Father   . Hyperlipidemia Maternal Grandmother   . Hypertension Maternal Grandmother   . Arthritis Maternal Grandmother     SOCIAL HX:   Married  [employer]   Current Outpatient Medications:  .  FLUoxetine (PROZAC) 10 MG tablet, Take 1 tablet (10 mg total) by mouth daily., Disp: 90 tablet, Rfl: 3 .  Multiple Vitamin (MULTI-VITAMINS) TABS, Take by mouth., Disp: , Rfl:  .  traZODone (DESYREL) 50 MG tablet, TAKE 0.5-1 TABLETS (25-50 MG TOTAL) BY MOUTH AT BEDTIME AS NEEDED FOR SLEEP., Disp: 90 tablet, Rfl:  1 .  valACYclovir (VALTREX) 1000 MG tablet, as needed. , Disp: , Rfl: 1  EXAM:  VITALS per patient if applicable:  GENERAL: alert, oriented, appears well and in no acute distress  HEENT: atraumatic, conjunttiva clear, no obvious abnormalities on inspection of external nose and ears  NECK: normal movements of the head and neck  LUNGS: on inspection no signs of respiratory distress, breathing rate appears normal, no obvious gross SOB, gasping or wheezing  CV: no obvious cyanosis  MS: moves all visible extremities without noticeable abnormality  PSYCH/NEURO: pleasant and cooperative, no obvious depression or anxiety, speech and thought processing grossly intact  ASSESSMENT AND PLAN:  Discussed the following assessment and plan:  Hyperlipidemia, unspecified hyperlipidemia type  -given cholesterol info  rec otc pure fish oil/EPA daily   HM utd flu UTD Tdap MMR immune Hep B 2/2 need to check titer in future  Cont exercise to 2x per week healthy diet choices  No need for dermatology now benign nevi Colonoscopy consider in future  Check PSA with fasting labs summer 2021    -we discussed possible serious and likely etiologies, options for evaluation and workup, limitations of telemedicine visit vs in person visit, treatment, treatment risks and precautions. Pt prefers to treat via telemedicine empirically rather then risking or undertaking an in person visit at this moment. Patient agrees to seek prompt in person care if worsening, new symptoms arise, or if is  not improving with treatment.   I discussed the assessment and treatment plan with the patient. The patient was provided an opportunity to ask questions and all were answered. The patient agreed with the plan and demonstrated an understanding of the instructions.   The patient was advised to call back or seek an in-person evaluation if the symptoms worsen or if the condition fails to improve as anticipated.  Time spent 15  minutes Delorise Jackson, MD

## 2019-05-02 NOTE — Patient Instructions (Addendum)
Shoulder Exercises Ask your health care provider which exercises are safe for you. Do exercises exactly as told by your health care provider and adjust them as directed. It is normal to feel mild stretching, pulling, tightness, or discomfort as you do these exercises. Stop right away if you feel sudden pain or your pain gets worse. Do not begin these exercises until told by your health care provider. Stretching exercises External rotation and abduction This exercise is sometimes called corner stretch. This exercise rotates your arm outward (external rotation) and moves your arm out from your body (abduction). 1. Stand in a doorway with one of your feet slightly in front of the other. This is called a staggered stance. If you cannot reach your forearms to the door frame, stand facing a corner of a room. 2. Choose one of the following positions as told by your health care provider: ? Place your hands and forearms on the door frame above your head. ? Place your hands and forearms on the door frame at the height of your head. ? Place your hands on the door frame at the height of your elbows. 3. Slowly move your weight onto your front foot until you feel a stretch across your chest and in the front of your shoulders. Keep your head and chest upright and keep your abdominal muscles tight. 4. Hold for __________ seconds. 5. To release the stretch, shift your weight to your back foot. Repeat __________ times. Complete this exercise __________ times a day. Extension, standing 1. Stand and hold a broomstick, a cane, or a similar object behind your back. ? Your hands should be a little wider than shoulder width apart. ? Your palms should face away from your back. 2. Keeping your elbows straight and your shoulder muscles relaxed, move the stick away from your body until you feel a stretch in your shoulders (extension). ? Avoid shrugging your shoulders while you move the stick. Keep your shoulder blades tucked  down toward the middle of your back. 3. Hold for __________ seconds. 4. Slowly return to the starting position. Repeat __________ times. Complete this exercise __________ times a day. Range-of-motion exercises Pendulum  1. Stand near a wall or a surface that you can hold onto for balance. 2. Bend at the waist and let your left / right arm hang straight down. Use your other arm to support you. Keep your back straight and do not lock your knees. 3. Relax your left / right arm and shoulder muscles, and move your hips and your trunk so your left / right arm swings freely. Your arm should swing because of the motion of your body, not because you are using your arm or shoulder muscles. 4. Keep moving your hips and trunk so your arm swings in the following directions, as told by your health care provider: ? Side to side. ? Forward and backward. ? In clockwise and counterclockwise circles. 5. Continue each motion for __________ seconds, or for as long as told by your health care provider. 6. Slowly return to the starting position. Repeat __________ times. Complete this exercise __________ times a day. Shoulder flexion, standing  1. Stand and hold a broomstick, a cane, or a similar object. Place your hands a little more than shoulder width apart on the object. Your left / right hand should be palm up, and your other hand should be palm down. 2. Keep your elbow straight and your shoulder muscles relaxed. Push the stick up with your healthy arm to  raise your left / right arm in front of your body, and then over your head until you feel a stretch in your shoulder (flexion). ? Avoid shrugging your shoulder while you raise your arm. Keep your shoulder blade tucked down toward the middle of your back. 3. Hold for __________ seconds. 4. Slowly return to the starting position. Repeat __________ times. Complete this exercise __________ times a day. Shoulder abduction, standing 1. Stand and hold a broomstick,  a cane, or a similar object. Place your hands a little more than shoulder width apart on the object. Your left / right hand should be palm up, and your other hand should be palm down. 2. Keep your elbow straight and your shoulder muscles relaxed. Push the object across your body toward your left / right side. Raise your left / right arm to the side of your body (abduction) until you feel a stretch in your shoulder. ? Do not raise your arm above shoulder height unless your health care provider tells you to do that. ? If directed, raise your arm over your head. ? Avoid shrugging your shoulder while you raise your arm. Keep your shoulder blade tucked down toward the middle of your back. 3. Hold for __________ seconds. 4. Slowly return to the starting position. Repeat __________ times. Complete this exercise __________ times a day. Internal rotation  1. Place your left / right hand behind your back, palm up. 2. Use your other hand to dangle an exercise band, a towel, or a similar object over your shoulder. Grasp the band with your left / right hand so you are holding on to both ends. 3. Gently pull up on the band until you feel a stretch in the front of your left / right shoulder. The movement of your arm toward the center of your body is called internal rotation. ? Avoid shrugging your shoulder while you raise your arm. Keep your shoulder blade tucked down toward the middle of your back. 4. Hold for __________ seconds. 5. Release the stretch by letting go of the band and lowering your hands. Repeat __________ times. Complete this exercise __________ times a day. Strengthening exercises External rotation  1. Sit in a stable chair without armrests. 2. Secure an exercise band to a stable object at elbow height on your left / right side. 3. Place a soft object, such as a folded towel or a small pillow, between your left / right upper arm and your body to move your elbow about 4 inches (10 cm) away  from your side. 4. Hold the end of the exercise band so it is tight and there is no slack. 5. Keeping your elbow pressed against the soft object, slowly move your forearm out, away from your abdomen (external rotation). Keep your body steady so only your forearm moves. 6. Hold for __________ seconds. 7. Slowly return to the starting position. Repeat __________ times. Complete this exercise __________ times a day. Shoulder abduction  1. Sit in a stable chair without armrests, or stand up. 2. Hold a __________ weight in your left / right hand, or hold an exercise band with both hands. 3. Start with your arms straight down and your left / right palm facing in, toward your body. 4. Slowly lift your left / right hand out to your side (abduction). Do not lift your hand above shoulder height unless your health care provider tells you that this is safe. ? Keep your arms straight. ? Avoid shrugging your shoulder while you  do this movement. Keep your shoulder blade tucked down toward the middle of your back. 5. Hold for __________ seconds. 6. Slowly lower your arm, and return to the starting position. Repeat __________ times. Complete this exercise __________ times a day. Shoulder extension 1. Sit in a stable chair without armrests, or stand up. 2. Secure an exercise band to a stable object in front of you so it is at shoulder height. 3. Hold one end of the exercise band in each hand. Your palms should face each other. 4. Straighten your elbows and lift your hands up to shoulder height. 5. Step back, away from the secured end of the exercise band, until the band is tight and there is no slack. 6. Squeeze your shoulder blades together as you pull your hands down to the sides of your thighs (extension). Stop when your hands are straight down by your sides. Do not let your hands go behind your body. 7. Hold for __________ seconds. 8. Slowly return to the starting position. Repeat __________ times.  Complete this exercise __________ times a day. Shoulder row 1. Sit in a stable chair without armrests, or stand up. 2. Secure an exercise band to a stable object in front of you so it is at waist height. 3. Hold one end of the exercise band in each hand. Position your palms so that your thumbs are facing the ceiling (neutral position). 4. Bend each of your elbows to a 90-degree angle (right angle) and keep your upper arms at your sides. 5. Step back until the band is tight and there is no slack. 6. Slowly pull your elbows back behind you. 7. Hold for __________ seconds. 8. Slowly return to the starting position. Repeat __________ times. Complete this exercise __________ times a day. Shoulder press-ups  1. Sit in a stable chair that has armrests. Sit upright, with your feet flat on the floor. 2. Put your hands on the armrests so your elbows are bent and your fingers are pointing forward. Your hands should be about even with the sides of your body. 3. Push down on the armrests and use your arms to lift yourself off the chair. Straighten your elbows and lift yourself up as much as you comfortably can. ? Move your shoulder blades down, and avoid letting your shoulders move up toward your ears. ? Keep your feet on the ground. As you get stronger, your feet should support less of your body weight as you lift yourself up. 4. Hold for __________ seconds. 5. Slowly lower yourself back into the chair. Repeat __________ times. Complete this exercise __________ times a day. Wall push-ups  1. Stand so you are facing a stable wall. Your feet should be about one arm-length away from the wall. 2. Lean forward and place your palms on the wall at shoulder height. 3. Keep your feet flat on the floor as you bend your elbows and lean forward toward the wall. 4. Hold for __________ seconds. 5. Straighten your elbows to push yourself back to the starting position. Repeat __________ times. Complete this exercise  __________ times a day. This information is not intended to replace advice given to you by your health care provider. Make sure you discuss any questions you have with your health care provider. Document Released: 03/22/2005 Document Revised: 08/30/2018 Document Reviewed: 06/07/2018 Elsevier Patient Education  2020 [employer].  Budget-Friendly Healthy Eating There are many ways to save money at the grocery store and continue to eat healthy. You can be successful  if you:  Plan meals according to your budget.  Make a grocery list and only purchase food according to your grocery list.  Prepare food yourself. What are tips for following this plan?  Reading food labels  Compare food labels between brand name foods and the store brand. Often the nutritional value is the same, but the store brand is lower cost.  Look for products that do not have added sugar, fat, or salt (sodium). These often cost the same but are healthier for you. Products may be labeled as: ? Sugar-free. ? Nonfat. ? Low-fat. ? Sodium-free. ? Low-sodium.  Look for lean ground beef labeled as at least 92% lean and 8% fat. Shopping  Buy only the items on your grocery list and go only to the areas of the store that have the items on your list.  Use coupons only for foods and brands you normally buy. Avoid buying items you wouldn't normally buy simply because they are on sale.  Check online and in newspapers for weekly deals.  Buy healthy items from the bulk bins when available, such as herbs, spices, flour, pasta, nuts, and dried fruit.  Buy fruits and vegetables that are in season. Prices are usually lower on in-season produce.  Look at the unit price on the price tag. Use it to compare different brands and sizes to find out which item is the best deal.  Choose healthy items that are often low-cost, such as carrots, potatoes, apples, bananas, and oranges. Dried or canned beans are a low-cost protein  source.  Buy in bulk and freeze extra food. Items you can buy in bulk include meats, fish, poultry, frozen fruits, and frozen vegetables.  Avoid buying "ready-to-eat" foods, such as pre-cut fruits and vegetables and pre-made salads.  If possible, shop around to discover where you can find the best prices. Consider other retailers such as dollar stores, larger [employer], local fruit and vegetable stands, and farmers markets.  Do not shop when you are hungry. If you shop while hungry, it may be hard to stick to your list and budget.  Resist impulse buying. Use your grocery list as your official plan for the week.  Buy a variety of vegetables and fruits by purchasing fresh, frozen, and canned items.  Look at the top and bottom shelves for deals. Foods at eye level (eye level of an adult or child) are usually more expensive.  Be efficient with your time when shopping. The more time you spend at the store, the more money you are likely to spend.  To save money when choosing more expensive foods like meats and dairy: ? Choose cheaper cuts of meat, such as bone-in chicken thighs and drumsticks instead of skinless and boneless chicken. When you are ready to prepare the chicken, you can remove the skin yourself to make it healthier. ? Choose lean meats like chicken or [place] instead of beef. ? Choose canned seafood, such as tuna, salmon, or sardines. ? Buy eggs as a low-cost source of protein. ? Buy dried beans and peas, such as lentils, split peas, or kidney beans instead of meats. Dried beans and peas are a good alternative source of protein. ? Buy the larger tubs of yogurt instead of individual-sized containers.  Choose water instead of sodas and other sweetened beverages.  Avoid buying chips, cookies, and other "junk food." These items are usually expensive and not healthy. Cooking  Make extra food and freeze the extras in meal-sized containers or in individual portions  for fast  meals and snacks.  Pre-cook on days when you have extra time to prepare meals in advance. You can keep these meals in the fridge or freezer and reheat for a quick meal.  When you come home from the grocery store, wash, peel, and cut fruits and vegetables so they are ready to use and eat. This will help reduce food waste. Meal planning  Do not eat out or get fast food. Prepare food at home.  Make a grocery list and make sure to bring it with you to the store. If you have a smart phone, you could use your phone to create your shopping list.  Plan meals and snacks according to a grocery list and budget you create.  Use leftovers in your meal plan for the week.  Look for recipes where you can cook once and make enough food for two meals.  Include budget-friendly meals like stews, casseroles, and stir-fry dishes.  Try some meatless meals or try "no cook" meals like salads.  Make sure that half your plate is filled with fruits or vegetables. Choose from fresh, frozen, or canned fruits and vegetables. If eating canned, remember to rinse them before eating. This will remove any excess salt added for packaging. Summary  Eating healthy on a budget is possible if you plan your meals according to your budget, purchase according to your budget and grocery list, and prepare food yourself.  Tips for buying more food on a limited budget include buying generic brands, using coupons only for foods you normally buy, and buying healthy items from the bulk bins when available.  Tips for buying cheaper food to replace expensive food include choosing cheaper, lean cuts of meat, and buying dried beans and peas. This information is not intended to replace advice given to you by your health care provider. Make sure you discuss any questions you have with your health care provider. Document Released: 01/09/2014 Document Revised: 05/09/2017 Document Reviewed: 05/09/2017 Elsevier Patient Education  2020 [place] Following a healthy eating pattern may help you to achieve and maintain a healthy body weight, reduce the risk of chronic disease, and live a long and productive life. It is important to follow a healthy eating pattern at an appropriate calorie level for your body. Your nutritional needs should be met primarily through food by choosing a variety of nutrient-rich foods. What are tips for following this plan? Reading food labels  Read labels and choose the following: ? Reduced or low sodium. ? Juices with 100% fruit juice. ? Foods with low saturated fats and high polyunsaturated and monounsaturated fats. ? Foods with whole grains, such as whole wheat, cracked wheat, brown rice, and wild rice. ? Whole grains that are fortified with folic acid. This is recommended for women who are pregnant or who want to become pregnant.  Read labels and avoid the following: ? Foods with a lot of added sugars. These include foods that contain brown sugar, corn sweetener, corn syrup, dextrose, fructose, glucose, high-fructose corn syrup, honey, invert sugar, lactose, malt syrup, maltose, molasses, raw sugar, sucrose, trehalose, or turbinado sugar.  Do not eat more than the following amounts of added sugar per day:  6 teaspoons (25 g) for women.  9 teaspoons (38 g) for men. ? Foods that contain processed or refined starches and grains. ? Refined grain products, such as white flour, degermed cornmeal, white bread, and white rice. Shopping  Choose nutrient-rich snacks, such as vegetables, whole fruits,  and nuts. Avoid high-calorie and high-sugar snacks, such as potato chips, fruit snacks, and candy.  Use oil-based dressings and spreads on foods instead of solid fats such as butter, stick margarine, or cream cheese.  Limit pre-made sauces, mixes, and "instant" products such as flavored rice, instant noodles, and ready-made pasta.  Try more plant-protein sources, such as tofu, tempeh,  black beans, edamame, lentils, nuts, and seeds.  Explore eating plans such as the Mediterranean diet or vegetarian diet. Cooking  Use oil to saut or stir-fry foods instead of solid fats such as butter, stick margarine, or lard.  Try baking, boiling, grilling, or broiling instead of frying.  Remove the fatty part of meats before cooking.  Steam vegetables in water or broth. Meal planning   At meals, imagine dividing your plate into fourths: ? One-half of your plate is fruits and vegetables. ? One-fourth of your plate is whole grains. ? One-fourth of your plate is protein, especially lean meats, poultry, eggs, tofu, beans, or nuts.  Include low-fat dairy as part of your daily diet. Lifestyle  Choose healthy options in all settings, including home, work, school, restaurants, or stores.  Prepare your food safely: ? Wash your hands after handling raw meats. ? Keep food preparation surfaces clean by regularly washing with hot, soapy water. ? Keep raw meats separate from ready-to-eat foods, such as fruits and vegetables. ? Cook seafood, meat, poultry, and eggs to the recommended internal temperature. ? Store foods at safe temperatures. In general:  Keep cold foods at 97F (4.4C) or below.  Keep hot foods at 197F (60C) or above.  Keep your freezer at [facility] (-17.8C) or below.  Foods are no longer safe to eat when they have been between the temperatures of 40-197F (4.4-60C) for more than 2 hours. What foods should I eat? Fruits Aim to eat 2 cup-equivalents of fresh, canned (in natural juice), or frozen fruits each day. Examples of 1 cup-equivalent of fruit include 1 small apple, 8 large strawberries, 1 cup canned fruit,  cup dried fruit, or 1 cup 100% juice. Vegetables Aim to eat 2-3 cup-equivalents of fresh and frozen vegetables each day, including different varieties and colors. Examples of 1 cup-equivalent of vegetables include 2 medium carrots, 2 cups raw, leafy greens, 1  cup chopped vegetable (raw or cooked), or 1 medium baked potato. Grains Aim to eat 6 ounce-equivalents of whole grains each day. Examples of 1 ounce-equivalent of grains include 1 slice of bread, 1 cup ready-to-eat cereal, 3 cups popcorn, or  cup cooked rice, pasta, or cereal. Meats and other proteins Aim to eat 5-6 ounce-equivalents of protein each day. Examples of 1 ounce-equivalent of protein include 1 egg, 1/2 cup nuts or seeds, or 1 tablespoon (16 g) peanut butter. A cut of meat or fish that is the size of a deck of cards is about 3-4 ounce-equivalents.  Of the protein you eat each week, try to have at least 8 ounces come from seafood. This includes salmon, trout, herring, and anchovies. Dairy Aim to eat 3 cup-equivalents of fat-free or low-fat dairy each day. Examples of 1 cup-equivalent of dairy include 1 cup (240 mL) milk, 8 ounces (250 g) yogurt, 1 ounces (44 g) natural cheese, or 1 cup (240 mL) fortified soy milk. Fats and oils  Aim for about 5 teaspoons (21 g) per day. Choose monounsaturated fats, such as canola and olive oils, avocados, peanut butter, and most nuts, or polyunsaturated fats, such as sunflower, corn, and soybean oils, walnuts, pine nuts,  sesame seeds, sunflower seeds, and flaxseed. Beverages  Aim for six 8-oz glasses of water per day. Limit coffee to three to five 8-oz cups per day.  Limit caffeinated beverages that have added calories, such as soda and energy drinks.  Limit alcohol intake to no more than 1 drink a day for nonpregnant women and 2 drinks a day for men. One drink equals 12 oz of beer (355 mL), 5 oz of wine (148 mL), or 1 oz of hard liquor (44 mL). Seasoning and other foods  Avoid adding excess amounts of salt to your foods. Try flavoring foods with herbs and spices instead of salt.  Avoid adding sugar to foods.  Try using oil-based dressings, sauces, and spreads instead of solid fats. This information is based on general [place] nutrition  guidelines. For more information, visit [employer]. Exact amounts may vary based on your nutrition needs. Summary  A healthy eating plan may help you to maintain a healthy weight, reduce the risk of chronic diseases, and stay active throughout your life.  Plan your meals. Make sure you eat the right portions of a variety of nutrient-rich foods.  Try baking, boiling, grilling, or broiling instead of frying.  Choose healthy options in all settings, including home, work, school, restaurants, or stores. This information is not intended to replace advice given to you by your health care provider. Make sure you discuss any questions you have with your health care provider. Document Released: 08/20/2017 Document Revised: 08/20/2017 Document Reviewed: 08/20/2017 Elsevier Patient Education  [place].  High Cholesterol  High cholesterol is a condition in which the blood has high levels of a white, waxy, fat-like substance (cholesterol). The human body needs small amounts of cholesterol. The liver makes all the cholesterol that the body needs. Extra (excess) cholesterol comes from the food that we eat. Cholesterol is carried from the liver by the blood through the blood vessels. If you have high cholesterol, deposits (plaques) may build up on the walls of your blood vessels (arteries). Plaques make the arteries narrower and stiffer. Cholesterol plaques increase your risk for heart attack and stroke. Work with your health care provider to keep your cholesterol levels in a healthy range. What increases the risk? This condition is more likely to develop in people who:  Eat foods that are high in animal fat (saturated fat) or cholesterol.  Are overweight.  Are not getting enough exercise.  Have a family history of high cholesterol. What are the signs or symptoms? There are no symptoms of this condition. How is this diagnosed? This condition may be diagnosed from the results of a  blood test.  If you are older than age 90, your health care provider may check your cholesterol every 4-6 years.  You may be checked more often if you already have high cholesterol or other risk factors for heart disease. The blood test for cholesterol measures:  "Bad" cholesterol (LDL cholesterol). This is the main type of cholesterol that causes heart disease. The desired level for LDL is less than 100.  "Good" cholesterol (HDL cholesterol). This type helps to protect against heart disease by cleaning the arteries and carrying the LDL away. The desired level for HDL is 60 or higher.  Triglycerides. These are fats that the body can store or burn for energy. The desired number for triglycerides is lower than 150.  Total cholesterol. This is a measure of the total amount of cholesterol in your blood, including LDL cholesterol, HDL cholesterol, and triglycerides. A  healthy number is less than 200. How is this treated? This condition is treated with diet changes, lifestyle changes, and medicines. Diet changes  This may include eating more whole grains, fruits, vegetables, nuts, and fish.  This may also include cutting back on red meat and foods that have a lot of added sugar. Lifestyle changes  Changes may include getting at least 40 minutes of aerobic exercise 3 times a week. Aerobic exercises include walking, biking, and swimming. Aerobic exercise along with a healthy diet can help you maintain a healthy weight.  Changes may also include quitting smoking. Medicines  Medicines are usually given if diet and lifestyle changes have failed to reduce your cholesterol to healthy levels.  Your health care provider may prescribe a statin medicine. Statin medicines have been shown to reduce cholesterol, which can reduce the risk of heart disease. Follow these instructions at home: Eating and drinking If told by your health care provider:  Eat chicken (without skin), fish, veal, shellfish,  ground [place] breast, and round or loin cuts of red meat.  Do not eat fried foods or fatty meats, such as hot dogs and salami.  Eat plenty of fruits, such as apples.  Eat plenty of vegetables, such as broccoli, potatoes, and carrots.  Eat beans, peas, and lentils.  Eat grains such as barley, rice, couscous, and bulgur wheat.  Eat pasta without cream sauces.  Use skim or nonfat milk, and eat low-fat or nonfat yogurt and cheeses.  Do not eat or drink whole milk, cream, ice cream, egg yolks, or hard cheeses.  Do not eat stick margarine or tub margarines that contain trans fats (also called partially hydrogenated oils).  Do not eat saturated tropical oils, such as coconut oil and palm oil.  Do not eat cakes, cookies, crackers, or other baked goods that contain trans fats.  General instructions  Exercise as directed by your health care provider. Increase your activity level with activities such as gardening, walking, and taking the stairs.  Take over-the-counter and prescription medicines only as told by your health care provider.  Do not use any products that contain nicotine or tobacco, such as cigarettes and e-cigarettes. If you need help quitting, ask your health care provider.  Keep all follow-up visits as told by your health care provider. This is important. Contact a health care provider if:  You are struggling to maintain a healthy diet or weight.  You need help to start on an exercise program.  You need help to stop smoking. Get help right away if:  You have chest pain.  You have trouble breathing. This information is not intended to replace advice given to you by your health care provider. Make sure you discuss any questions you have with your health care provider. Document Released: 05/08/2005 Document Revised: 05/11/2017 Document Reviewed: 11/06/2015 Elsevier Patient Education  [place].  Cholesterol Content in Foods Cholesterol is a waxy, fat-like  substance that helps to carry fat in the blood. The body needs cholesterol in small amounts, but too much cholesterol can cause damage to the arteries and heart. Most people should eat less than 200 milligrams (mg) of cholesterol a day. Foods with cholesterol  Cholesterol is found in animal-based foods, such as meat, seafood, and dairy. Generally, low-fat dairy and lean meats have less cholesterol than full-fat dairy and fatty meats. The milligrams of cholesterol per serving (mg per serving) of common cholesterol-containing foods are listed below. Meat and other proteins  Egg -- one large whole  egg has 186 mg.  Veal shank -- 4 oz has 141 mg.  Lean ground [place] (93% lean) -- 4 oz has 118 mg.  Fat-trimmed lamb loin -- 4 oz has 106 mg.  Lean ground beef (90% lean) -- 4 oz has 100 mg.  Lobster -- 3.5 oz has 90 mg.  Pork loin chops -- 4 oz has 86 mg.  Canned salmon -- 3.5 oz has 83 mg.  Fat-trimmed beef top loin -- 4 oz has 78 mg.  Frankfurter -- 1 frank (3.5 oz) has 77 mg.  Crab -- 3.5 oz has 71 mg.  Roasted chicken without skin, white meat -- 4 oz has 66 mg.  Light bologna -- 2 oz has 45 mg.  Deli-cut [place] -- 2 oz has 31 mg.  Canned tuna -- 3.5 oz has 31 mg.  Berniece Salines -- 1 oz has 29 mg.  Oysters and mussels (raw) -- 3.5 oz has 25 mg.  Mackerel -- 1 oz has 22 mg.  Trout -- 1 oz has 20 mg.  Pork sausage -- 1 link (1 oz) has 17 mg.  Salmon -- 1 oz has 16 mg.  Tilapia -- 1 oz has 14 mg. Dairy  Soft-serve ice cream --  cup (4 oz) has 103 mg.  Whole-milk yogurt -- 1 cup (8 oz) has 29 mg.  Cheddar cheese -- 1 oz has 28 mg.  American cheese -- 1 oz has 28 mg.  Whole milk -- 1 cup (8 oz) has 23 mg.  2% milk -- 1 cup (8 oz) has 18 mg.  Cream cheese -- 1 tablespoon (Tbsp) has 15 mg.  Cottage cheese --  cup (4 oz) has 14 mg.  Low-fat (1%) milk -- 1 cup (8 oz) has 10 mg.  Sour cream -- 1 Tbsp has 8.5 mg.  Low-fat yogurt -- 1 cup (8 oz) has 8 mg.  Nonfat  Greek yogurt -- 1 cup (8 oz) has 7 mg.  Half-and-half cream -- 1 Tbsp has 5 mg. Fats and oils  Cod liver oil -- 1 tablespoon (Tbsp) has 82 mg.  Butter -- 1 Tbsp has 15 mg.  Lard -- 1 Tbsp has 14 mg.  Bacon grease -- 1 Tbsp has 14 mg.  Mayonnaise -- 1 Tbsp has 5-10 mg.  Margarine -- 1 Tbsp has 3-10 mg. Exact amounts of cholesterol in these foods may vary depending on specific ingredients and brands. Foods without cholesterol Most plant-based foods do not have cholesterol unless you combine them with a food that has cholesterol. Foods without cholesterol include:  Grains and cereals.  Vegetables.  Fruits.  Vegetable oils, such as olive, canola, and sunflower oil.  Legumes, such as peas, beans, and lentils.  Nuts and seeds.  Egg whites. Summary  The body needs cholesterol in small amounts, but too much cholesterol can cause damage to the arteries and heart.  Most people should eat less than 200 milligrams (mg) of cholesterol a day. This information is not intended to replace advice given to you by your health care provider. Make sure you discuss any questions you have with your health care provider. Document Released: 01/02/2017 Document Revised: 04/20/2017 Document Reviewed: 01/02/2017 Elsevier Patient Education  2020 [employer].    Colonoscopy, Adult A colonoscopy is an exam to look at the entire large intestine. During the exam, a lubricated, flexible tube that has a camera on the end of it is inserted into the anus and then passed into the rectum, colon, and other parts of  the large intestine. You may have a colonoscopy as a part of normal colorectal screening or if you have certain symptoms, such as:  Lack of red blood cells (anemia).  Diarrhea that does not go away.  Abdominal pain.  Blood in your stool (feces). A colonoscopy can help screen for and diagnose medical problems, including:  Tumors.  Polyps.  Inflammation.  Areas of bleeding. Tell a  health care provider about:  Any allergies you have.  All medicines you are taking, including vitamins, herbs, eye drops, creams, and over-the-counter medicines.  Any problems you or family members have had with anesthetic medicines.  Any blood disorders you have.  Any surgeries you have had.  Any medical conditions you have.  Any problems you have had passing stool. What are the risks? Generally, this is a safe procedure. However, problems may occur, including:  Bleeding.  A tear in the intestine.  A reaction to medicines given during the exam.  Infection (rare). What happens before the procedure? Eating and drinking restrictions Follow instructions from your health care provider about eating and drinking, which may include:  A few days before the procedure - follow a low-fiber diet. Avoid nuts, seeds, dried fruit, raw fruits, and vegetables.  1-3 days before the procedure - follow a clear liquid diet. Drink only clear liquids, such as clear broth or bouillon, black coffee or tea, clear juice, clear soft drinks or sports drinks, gelatin dessert, and popsicles. Avoid any liquids that contain red or purple dye.  On the day of the procedure - do not eat or drink anything starting 2 hours before the procedure, or within the time period that your health care provider recommends. Up to 2 hours before the procedure, you may continue to drink clear liquids, such as water or clear fruit juice. Bowel prep If you were prescribed an oral bowel prep to clean out your colon:  Take it as told by your health care provider. Starting the day before your procedure, you will need to drink a large amount of medicated liquid. The liquid will cause you to have multiple loose stools until your stool is almost clear or light green.  If your skin or anus gets irritated from diarrhea, you may use these to relieve the irritation: ? Medicated wipes, such as adult wet wipes with aloe and vitamin E. ? A  skin-soothing product like petroleum jelly.  If you vomit while drinking the bowel prep, take a break for up to 60 minutes and then begin the bowel prep again. If vomiting continues and you cannot take the bowel prep without vomiting, call your health care provider.  To clean out your colon, you may also be given: ? Laxative medicines. ? Instructions about how to use an enema. General instructions  Ask your health care provider about: ? Changing or stopping your regular medicines or supplements. This is especially important if you are taking iron supplements, diabetes medicines, or blood thinners. ? Taking medicines such as aspirin and ibuprofen. These medicines can thin your blood. Do not take these medicines before the procedure if your health care provider tells you not to.  Plan to have someone take you home from the hospital or clinic. What happens during the procedure?   An IV may be inserted into one of your veins.  You will be given medicine to help you relax (sedative).  To reduce your risk of infection: ? Your health care team will wash or sanitize their hands. ? Your anal area  will be washed with soap.  You will be asked to lie on your side with your knees bent.  Your health care provider will lubricate a long, thin, flexible tube. The tube will have a camera and a light on the end.  The tube will be inserted into your anus.  The tube will be gently eased through your rectum and colon.  Air will be delivered into your colon to keep it open. You may feel some pressure or cramping.  The camera will be used to take images during the procedure.  A small tissue sample may be removed to be examined under a microscope (biopsy).  If small polyps are found, your health care provider may remove them and have them checked for cancer cells.  When the exam is done, the tube will be removed. The procedure may vary among health care providers and hospitals. What happens after  the procedure?  Your blood pressure, heart rate, breathing rate, and blood oxygen level will be monitored until the medicines you were given have worn off.  Do not drive for 24 hours after the exam.  You may have a small amount of blood in your stool.  You may pass gas and have mild abdominal cramping or bloating due to the air that was used to inflate your colon during the exam.  It is up to you to get the results of your procedure. Ask your health care provider, or the department performing the procedure, when your results will be ready. Summary  A colonoscopy is an exam to look at the entire large intestine.  During a colonoscopy, a lubricated, flexible tube with a camera on the end of it is inserted into the anus and then passed into the colon and other parts of the large intestine.  Follow instructions from your health care provider about eating and drinking before the procedure.  If you were prescribed an oral bowel prep to clean out your colon, take it as told by your health care provider.  After your procedure, your blood pressure, heart rate, breathing rate, and blood oxygen level will be monitored until the medicines you were given have worn off. This information is not intended to replace advice given to you by your health care provider. Make sure you discuss any questions you have with your health care provider. Document Released: 05/05/2000 Document Revised: 02/28/2017 Document Reviewed: 07/20/2015 Elsevier Patient Education  2020 [employer].

## 2019-05-06 ENCOUNTER — Ambulatory Visit (INDEPENDENT_AMBULATORY_CARE_PROVIDER_SITE_OTHER): Payer: 59 | Admitting: Psychology

## 2019-05-06 DIAGNOSIS — F411 Generalized anxiety disorder: Secondary | ICD-10-CM | POA: Diagnosis not present

## 2019-06-20 ENCOUNTER — Ambulatory Visit (INDEPENDENT_AMBULATORY_CARE_PROVIDER_SITE_OTHER): Payer: 59 | Admitting: Psychology

## 2019-06-20 DIAGNOSIS — F411 Generalized anxiety disorder: Secondary | ICD-10-CM | POA: Diagnosis not present

## 2019-07-21 ENCOUNTER — Encounter: Payer: Self-pay | Admitting: Family Medicine

## 2019-07-21 ENCOUNTER — Other Ambulatory Visit: Payer: Self-pay

## 2019-07-21 ENCOUNTER — Ambulatory Visit: Payer: 59 | Admitting: Family Medicine

## 2019-07-21 VITALS — BP 120/84 | HR 61 | Ht 70.0 in | Wt 216.0 lb

## 2019-07-21 DIAGNOSIS — S39012A Strain of muscle, fascia and tendon of lower back, initial encounter: Secondary | ICD-10-CM | POA: Diagnosis not present

## 2019-07-21 MED ORDER — CYCLOBENZAPRINE HCL 10 MG PO TABS: 10.0000 mg | ORAL_TABLET | Freq: Three times a day (TID) | ORAL | 0 refills | Status: DC | PRN

## 2019-07-21 NOTE — Patient Instructions (Addendum)
Thank you for coming in today.  Continue home exercises.  Use heat and TENS unit.  Try the muscle relaxer at bedtime.  If you want to try PT let me know.  Chiropractor care is ok as well.   TENS UNIT: This is helpful for muscle pain and spasm.   Search and Purchase a TENS 7000 2nd edition at  www.tenspros.com or www.Amazon.com It should be less than $30.     TENS unit instructions: Do not shower or bathe with the unit on Turn the unit off before removing electrodes or batteries If the electrodes lose stickiness add a drop of water to the electrodes after they are disconnected from the unit and place on plastic sheet. If you continued to have difficulty, call the TENS unit company to purchase more electrodes. Do not apply lotion on the skin area prior to use. Make sure the skin is clean and dry as this will help prolong the life of the electrodes. After use, always check skin for unusual red areas, rash or other skin difficulties. If there are any skin problems, does not apply electrodes to the same area. Never remove the electrodes from the unit by pulling the wires. Do not use the TENS unit or electrodes other than as directed. Do not change electrode placement without consultating your therapist or physician. Keep 2 fingers with between each electrode. Wear time ratio is 2:1, on to off times.    For example on for 30 minutes off for 15 minutes and then on for 30 minutes off for 15 minutes

## 2019-07-21 NOTE — Progress Notes (Signed)
    Subjective:    CC: Low back pain  I, Kana Thompson, LAT, ATC, am serving as scribe for Dr. [name].  HPI: Pt is a 44 y/o male presenting w/ c/o low back pain that has been going on for 2 weeks. Patient was picking up the barbell when he felt his back "catch". He rates his pain at a 5/10 and describes his pain as sharp for the first several days. Back is stiff and achy. Pain is worse in the mornings. Pain is worse on the lower left side.  Patient has had several visits with chiropractic care with only moderate benefit.  Radiating pain: No  LE numbness/tingling: No LE weakness: No  Aggravating factors: flexion, ADLs Treatments tried: Ice, heat, epsom salt, massage   Diagnostic testing: None  Pertinent review of Systems: No fevers or chills.  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   MSK: L-spine: Normal-appearing Nontender to spinal midline.  Tender palpation left lumbar paraspinal musculature and SI joint. Lumbar motion normal extension rotation and lateral flexion.  Limited flexion. Lower extremity strength reflexes and sensation are equal normal throughout bilateral lower extremities.  Lab and Radiology Results X-ray report lumbar spine 2019 from [facility] reviewed.  Images not available.   Impression and Recommendations:    Assessment and Plan: 44 y.o. male with low back pain occurring for approximately 2 weeks.  Patient describes lumbosacral strain.  No evidence of severe injury.  Plan for conservative management including trial of cyclobenzaprine TENS unit heating pad.  Okay to continue chiropractic care for a week or 2 longer.  If no further benefit would recommend referral to physical therapy.  Also proceed with trial of SI joint injection.  He  may benefit from dry needling with physical therapy along with a strengthening approach.  Precautions reviewed recheck back as needed if not improving..    Meds ordered this encounter  Medications  . cyclobenzaprine (FLEXERIL) 10 MG tablet    Sig: Take 1 tablet (10 mg total) by mouth 3 (three) times daily as needed for muscle spasms.    Dispense:  30 tablet    Refill:  0    Discussed warning signs or symptoms. Please see discharge instructions. Patient expresses understanding.   The above documentation has been reviewed and is accurate and complete [name]

## 2019-08-04 ENCOUNTER — Encounter: Payer: Self-pay | Admitting: Family Medicine

## 2019-08-04 DIAGNOSIS — S39012A Strain of muscle, fascia and tendon of lower back, initial encounter: Secondary | ICD-10-CM

## 2019-08-05 NOTE — Addendum Note (Signed)
Addended by: Rodolph Bong on: 08/05/2019 01:33 PM   Modules accepted: Orders

## 2019-08-12 ENCOUNTER — Other Ambulatory Visit: Payer: Self-pay

## 2019-08-12 ENCOUNTER — Ambulatory Visit
Admission: RE | Admit: 2019-08-12 | Discharge: 2019-08-12 | Disposition: A | Discharge status: Home / Self Care | Payer: 59 | Source: Ambulatory Visit | Attending: Chiropractic Medicine | Admitting: Chiropractic Medicine

## 2019-08-12 ENCOUNTER — Other Ambulatory Visit: Payer: Self-pay | Admitting: Chiropractic Medicine

## 2019-08-12 DIAGNOSIS — M9903 Segmental and somatic dysfunction of lumbar region: Secondary | ICD-10-CM | POA: Insufficient documentation

## 2019-08-12 DIAGNOSIS — M5442 Lumbago with sciatica, left side: Secondary | ICD-10-CM | POA: Diagnosis present

## 2019-08-12 DIAGNOSIS — M9904 Segmental and somatic dysfunction of sacral region: Secondary | ICD-10-CM

## 2019-08-12 DIAGNOSIS — M461 Sacroiliitis, not elsewhere classified: Secondary | ICD-10-CM | POA: Insufficient documentation

## 2019-08-25 ENCOUNTER — Other Ambulatory Visit: Payer: Self-pay

## 2019-08-25 ENCOUNTER — Ambulatory Visit: Payer: 59 | Attending: Internal Medicine

## 2019-08-25 DIAGNOSIS — Z23 Encounter for immunization: Secondary | ICD-10-CM

## 2019-08-25 NOTE — Progress Notes (Signed)
   Covid-19 Vaccination Clinic  Name:  Kevin [name]    MRN: [id number] DOB: 07/14/1975  08/25/2019  [name] was observed post Covid-19 immunization for 15 minutes without incident. He was provided with Vaccine Information Sheet and instruction to access the V-Safe system.   [name] was instructed to call 911 with any severe reactions post vaccine: Marland Kitchen Difficulty breathing  . Swelling of face and throat  . A fast heartbeat  . A bad rash all over body  . Dizziness and weakness   Immunizations Administered    Name Date Dose VIS Date Route   Pfizer COVID-19 Vaccine 08/25/2019  9:12 AM 0.3 mL 05/02/2019 Intramuscular   Manufacturer: [employer], [employer]   Lot: [id number]   NDC: [address]-0

## 2019-09-05 ENCOUNTER — Ambulatory Visit: Payer: 59 | Admitting: Psychology

## 2019-09-17 ENCOUNTER — Ambulatory Visit: Payer: 59 | Attending: Internal Medicine

## 2019-09-17 DIAGNOSIS — Z23 Encounter for immunization: Secondary | ICD-10-CM

## 2019-09-17 NOTE — Progress Notes (Signed)
   Covid-19 Vaccination Clinic  Name:  Kevin [name]    MRN: [id number] DOB: [birth year]  09/17/2019  [name] was observed post Covid-19 immunization for 15 minutes without incident. He was provided with Vaccine Information Sheet and instruction to access the V-Safe system.   [name] was instructed to call 911 with any severe reactions post vaccine: Marland Kitchen Difficulty breathing  . Swelling of face and throat  . A fast heartbeat  . A bad rash all over body  . Dizziness and weakness   Immunizations Administered    Name Date Dose VIS Date [name]   Pfizer COVID-19 Vaccine 09/17/2019  9:38 AM 0.3 mL 07/16/2018 Intramuscular   Manufacturer: [employer], [employer]   Lot: [id number]   NDC: [address]-8

## 2019-09-22 ENCOUNTER — Ambulatory Visit (INDEPENDENT_AMBULATORY_CARE_PROVIDER_SITE_OTHER): Payer: 59 | Admitting: Psychology

## 2019-09-22 DIAGNOSIS — F411 Generalized anxiety disorder: Secondary | ICD-10-CM | POA: Diagnosis not present

## 2019-09-23 ENCOUNTER — Encounter: Payer: Self-pay | Admitting: Family Medicine

## 2019-09-23 ENCOUNTER — Ambulatory Visit: Payer: 59 | Admitting: Family Medicine

## 2019-09-23 ENCOUNTER — Other Ambulatory Visit: Payer: Self-pay

## 2019-09-23 ENCOUNTER — Other Ambulatory Visit (INDEPENDENT_AMBULATORY_CARE_PROVIDER_SITE_OTHER): Payer: 59

## 2019-09-23 VITALS — BP 110/76 | HR 63 | Ht 70.0 in | Wt 219.4 lb

## 2019-09-23 DIAGNOSIS — Z1389 Encounter for screening for other disorder: Secondary | ICD-10-CM

## 2019-09-23 DIAGNOSIS — E559 Vitamin D deficiency, unspecified: Secondary | ICD-10-CM | POA: Diagnosis not present

## 2019-09-23 DIAGNOSIS — E785 Hyperlipidemia, unspecified: Secondary | ICD-10-CM

## 2019-09-23 DIAGNOSIS — Z1329 Encounter for screening for other suspected endocrine disorder: Secondary | ICD-10-CM

## 2019-09-23 DIAGNOSIS — M545 Low back pain, unspecified: Secondary | ICD-10-CM

## 2019-09-23 DIAGNOSIS — Z1159 Encounter for screening for other viral diseases: Secondary | ICD-10-CM

## 2019-09-23 DIAGNOSIS — Z0000 Encounter for general adult medical examination without abnormal findings: Secondary | ICD-10-CM

## 2019-09-23 DIAGNOSIS — Z125 Encounter for screening for malignant neoplasm of prostate: Secondary | ICD-10-CM | POA: Diagnosis not present

## 2019-09-23 DIAGNOSIS — G8929 Other chronic pain: Secondary | ICD-10-CM | POA: Diagnosis not present

## 2019-09-23 DIAGNOSIS — Z0184 Encounter for antibody response examination: Secondary | ICD-10-CM

## 2019-09-23 LAB — LDL CHOLESTEROL, DIRECT: Direct LDL: 118 mg/dL

## 2019-09-23 LAB — COMPREHENSIVE METABOLIC PANEL
ALT: 27 U/L (ref 0–53)
AST: 21 U/L (ref 0–37)
Albumin: 4.3 g/dL (ref 3.5–5.2)
Alkaline Phosphatase: 94 U/L (ref 39–117)
BUN: 16 mg/dL (ref 6–23)
CO2: 29 mEq/L (ref 19–32)
Calcium: 9.1 mg/dL (ref 8.4–10.5)
Chloride: 104 mEq/L (ref 96–112)
Creatinine, Ser: 1.18 mg/dL (ref 0.40–1.50)
GFR: 66.49 mL/min (ref >60.00)
Glucose, Bld: 105 mg/dL — ABNORMAL HIGH (ref 70–99)
Potassium: 4.6 mEq/L (ref 3.5–5.1)
Sodium: 139 mEq/L (ref 135–145)
Total Bilirubin: 0.5 mg/dL (ref 0.2–1.2)
Total Protein: 6.8 g/dL (ref 6.0–8.3)

## 2019-09-23 LAB — LIPID PANEL
Cholesterol: 219 mg/dL — ABNORMAL HIGH (ref 0–200)
HDL: 36.7 mg/dL — ABNORMAL LOW (ref >39.00)
NonHDL: 182.67
Total CHOL/HDL Ratio: 6
Triglycerides: 251 mg/dL — ABNORMAL HIGH (ref 0.0–149.0)
VLDL: 50.2 mg/dL — ABNORMAL HIGH (ref 0.0–40.0)

## 2019-09-23 LAB — CBC WITH DIFFERENTIAL/PLATELET
Basophils Absolute: 0 10*3/uL (ref 0.0–0.1)
Basophils Relative: 0.8 % (ref 0.0–3.0)
Eosinophils Absolute: 0.2 10*3/uL (ref 0.0–0.7)
Eosinophils Relative: 4.4 % (ref 0.0–5.0)
HCT: 45.2 % (ref 39.0–52.0)
Hemoglobin: 15.8 g/dL (ref 13.0–17.0)
Lymphocytes Relative: 43 % (ref 12.0–46.0)
Lymphs Abs: 2.1 10*3/uL (ref 0.7–4.0)
MCHC: 34.9 g/dL (ref 30.0–36.0)
MCV: 96.3 fl (ref 78.0–100.0)
Monocytes Absolute: 0.7 10*3/uL (ref 0.1–1.0)
Monocytes Relative: 15.1 % — ABNORMAL HIGH (ref 3.0–12.0)
Neutro Abs: 1.8 10*3/uL (ref 1.4–7.7)
Neutrophils Relative %: 36.7 % — ABNORMAL LOW (ref 43.0–77.0)
Platelets: 240 10*3/uL (ref 150.0–400.0)
RBC: 4.7 Mil/uL (ref 4.22–5.81)
RDW: 12.5 % (ref 11.5–15.5)
WBC: 4.9 10*3/uL (ref 4.0–10.5)

## 2019-09-23 LAB — URINALYSIS, ROUTINE W REFLEX MICROSCOPIC
Bilirubin Urine: NEGATIVE
Hgb urine dipstick: NEGATIVE
Ketones, ur: NEGATIVE
Leukocytes,Ua: NEGATIVE
Nitrite: NEGATIVE
RBC / HPF: NONE SEEN (ref 0–?)
Specific Gravity, Urine: 1.02 (ref 1.000–1.030)
Total Protein, Urine: NEGATIVE
Urine Glucose: NEGATIVE
Urobilinogen, UA: 0.2 (ref 0.0–1.0)
pH: 6.5 (ref 5.0–8.0)

## 2019-09-23 LAB — PSA: PSA: 0.86 ng/mL (ref 0.10–4.00)

## 2019-09-23 LAB — TSH: TSH: 1.59 u[IU]/mL (ref 0.35–4.50)

## 2019-09-23 LAB — VITAMIN D 25 HYDROXY (VIT D DEFICIENCY, FRACTURES): VITD: 28.62 ng/mL — ABNORMAL LOW (ref 30.00–100.00)

## 2019-09-23 NOTE — Patient Instructions (Signed)
Thank you for coming in today. Plan for MRI.  Recheck following MRI to review images and discuss next step including facet injection.    Facet Joint Block The facet joints connect the bones of the spine (vertebrae). They make it possible for you to bend, twist, and make other movements with your spine. They also keep you from bending too far, twisting too far, and making other extreme movements. A facet joint block is a procedure in which a numbing medicine (anesthetic) is injected into a facet joint. In many cases, an anti-inflammatory medicine (steroid) is also injected. A facet joint block may be done:  To diagnose neck or back pain. If the pain gets better after a facet joint block, it means the pain is probably coming from the facet joint. If the pain does not get better, it means the pain is probably not coming from the facet joint.  To relieve neck or back pain that is caused by an inflamed facet joint. A facet joint block is only done to relieve pain if the pain does not improve with other methods, such as medicine, exercise programs, and physical therapy. Tell a health care provider about:  Any allergies you have.  All medicines you are taking, including vitamins, herbs, eye drops, creams, and over-the-counter medicines.  Any problems you or family members have had with anesthetic medicines.  Any blood disorders you have.  Any surgeries you have had.  Any medical conditions you have or have had.  Whether you are pregnant or may be pregnant. What are the risks? Generally, this is a safe procedure. However, problems may occur, including:  Bleeding.  Injury to a nerve near the injection site.  Pain at the injection site.  Weakness or numbness in areas controlled by nerves near the injection site.  Infection.  Temporary fluid retention.  Allergic reactions to medicines or dyes.  Injury to other structures or organs near the injection site. What happens before the  procedure? Medicines Ask your health care provider about:  Changing or stopping your regular medicines. This is especially important if you are taking diabetes medicines or blood thinners.  Taking medicines such as aspirin and ibuprofen. These medicines can thin your blood. Do not take these medicines unless your health care provider tells you to take them.  Taking over-the-counter medicines, vitamins, herbs, and supplements. Eating and drinking Follow instructions from your health care provider about eating and drinking, which may include:  8 hours before the procedure - stop eating heavy meals or foods, such as meat, fried foods, or fatty foods.  6 hours before the procedure - stop eating light meals or foods, such as toast or cereal.  6 hours before the procedure - stop drinking milk or drinks that contain milk.  2 hours before the procedure - stop drinking clear liquids. Staying hydrated Follow instructions from your health care provider about hydration, which may include:  Up to 2 hours before the procedure - you may continue to drink clear liquids, such as water, clear fruit juice, black coffee, and plain tea. General instructions  Do not use any products that contain nicotine or tobacco for at least 4-6 weeks before the procedure. These products include cigarettes, e-cigarettes, and chewing tobacco. If you need help quitting, ask your health care provider.  Plan to have someone take you home from the hospital or clinic.  Ask your health care provider: ? How your surgery site will be marked. ? What steps will be taken to help  prevent infection. These may include:  Removing hair at the surgery site.  Washing skin with a germ-killing soap.  Receiving antibiotic medicine. What happens during the procedure?   You will put on a hospital gown.  You will lie on your stomach on an X-ray table. You may be asked to lie in a different position if an injection will be made in  your neck.  Machines will be used to monitor your oxygen levels, heart rate, and blood pressure.  Your skin will be cleaned.  If an injection will be made in your neck, an IV will be inserted into one of your veins. Fluids and medicine will flow directly into your body through the IV.  A numbing medicine (local anesthetic) will be applied to your skin. Your skin may sting or burn for a moment.  A video X-ray machine (fluoroscopy) will be used to find the joint. In some cases, a CT scan may be used.  A contrast dye may be injected into the facet joint area to help find the joint.  When the joint is located, an anesthetic will be injected into the joint through the needle.  Your health care provider will ask you whether you feel pain relief. ? If you feel relief, a steroid may be injected to provide pain relief for a longer period of time. ? If you do not feel relief or feel only partial relief, additional injections of an anesthetic may be made in other facet joints.  The needle will be removed.  Your skin will be cleaned.  A bandage (dressing) will be applied over each injection site. The procedure may vary among health care providers and hospitals. What happens after the procedure?  Your blood pressure, heart rate, breathing rate, and blood oxygen level will be monitored until you leave the hospital or clinic.  You will lie down and rest for a period of time. Summary  A facet joint block is a procedure in which a numbing medicine (anesthetic) is injected into a facet joint. An anti-inflammatory medicine (stereoid) may also be injected.  Follow instructions from your health care provider about medicines and eating and drinking before the procedure.  Do not use any products that contain nicotine or tobacco for at least 4-6 weeks before the procedure.  You will lie on your stomach for the procedure, but you may be asked to lie in a different position if an injection will be made  in your neck.  When the joint is located, an anesthetic will be injected into the joint through the needle. This information is not intended to replace advice given to you by your health care provider. Make sure you discuss any questions you have with your health care provider. Document Revised: 08/29/2018 Document Reviewed: 04/12/2018 Elsevier Patient Education  [place].

## 2019-09-23 NOTE — Progress Notes (Signed)
I, Christoper [name], LAT, ATC, am serving as scribe for Dr. [name].  Kevin [name] is a 44 y.o. male who presents to [facility] at [facility] today for f/u of low back pain.  He was last seen by Dr. Denyse [name] on 07/21/19 after injuring his back when picking up a barbell at the gym.  He was provided w/ a HEP, prescribed Flexeril and given information on a TENs unit.  Since his last visit, pt reports that he was feeling a lot better until he played tennis w/ his son on Sunday afternoon.  He states that he has been having a lot of spasms and tightness in his back but this has improved over the last 24 hours.  He did complete 8-10 PT sessions at [facility] from his prior bout of back pain and has been doing his HEP.  He wants to make sure there's nothing more significant going on w/ his back to explain his back pain flare-ups.   Pain is located left lower back and does not pain is improving a bit  Diagnostic imaging: L-spine XR- 08/12/19 today from yesterday.   Pertinent review of systems: No fevers or chills  Relevant historical information: History of Mnire's disease   Exam:  BP 110/76 (BP Location: Right Arm, Patient Position: Sitting, Cuff Size: Large)   Pulse 63   Ht 5\' 10"  (1.778 m)   Wt 219 lb 6.4 oz (99.5 kg)   SpO2 96%   BMI 31.48 kg/m  General: Well Developed, well nourished, and in no acute distress.   MSK: L-spine normal-appearing nontender midline or lumbar paraspinal musculature or SI joint.  Normal motion.  Normal gait.  Lower extremity strength is intact.    Lab and Radiology Results:  DG Lumbar Spine 2-3 Views  Result Date: 08/12/2019 CLINICAL DATA:  Pain EXAM: LUMBAR SPINE - 2-3 VIEW COMPARISON:  None. FINDINGS: There is no evidence of lumbar spine fracture. Alignment is normal. Intervertebral disc spaces are maintained. Mild multilevel endplate spurring identified. IMPRESSION: Mild degenerative disc disease Electronically Signed   By: 08/14/2019 M.D.   On: 08/12/2019 10:00   I, 08/14/2019, personally (independently) visualized and performed the interpretation of the images attached in this note.   Assessment and Plan: 44 y.o. male with new onset left low back pain.  Patient has had several episodes of back pain most recently requiring greater than 6 weeks of physical therapy.  At this point I think it is reasonable to proceed with MRI to further characterize source of back pain.  Discussed possible causes including facet joints with patient.  Anticipate possible facet injections in the future.  Check back with me after MRI.   PDMP not reviewed this encounter. Orders Placed This Encounter  Procedures  . MR Lumbar Spine Wo Contrast    Standing Status:   Future    Standing Expiration Date:   11/22/2020    Order Specific Question:   What is the patient's sedation requirement?    Answer:   No Sedation    Order Specific Question:   Does the patient have a pacemaker or implanted devices?    Answer:   No    Order Specific Question:   Preferred imaging location?    Answer:   [facility] (table limit-550lbs)    Order Specific Question:   Radiology Contrast Protocol - do NOT remove file path    Answer:   [email or web address]   No orders of the  defined types were placed in this encounter.    Discussed warning signs or symptoms. Please see discharge instructions. Patient expresses understanding.   The above documentation has been reviewed and is accurate and complete Lynne Leader

## 2019-09-24 LAB — HEPATITIS B SURFACE ANTIBODY, QUANTITATIVE: Hep B S AB Quant (Post): >1000 m[IU]/mL (ref >=10)

## 2019-09-25 ENCOUNTER — Other Ambulatory Visit: Payer: Self-pay | Admitting: Internal Medicine

## 2019-09-25 DIAGNOSIS — D709 Neutropenia, unspecified: Secondary | ICD-10-CM

## 2019-09-25 DIAGNOSIS — D72821 Monocytosis (symptomatic): Secondary | ICD-10-CM

## 2019-09-25 DIAGNOSIS — Z113 Encounter for screening for infections with a predominantly sexual mode of transmission: Secondary | ICD-10-CM

## 2019-09-25 DIAGNOSIS — E538 Deficiency of other specified B group vitamins: Secondary | ICD-10-CM

## 2019-09-26 ENCOUNTER — Other Ambulatory Visit: Payer: Self-pay

## 2019-09-26 ENCOUNTER — Other Ambulatory Visit (INDEPENDENT_AMBULATORY_CARE_PROVIDER_SITE_OTHER): Payer: 59

## 2019-09-26 DIAGNOSIS — D72821 Monocytosis (symptomatic): Secondary | ICD-10-CM

## 2019-09-26 DIAGNOSIS — E538 Deficiency of other specified B group vitamins: Secondary | ICD-10-CM | POA: Diagnosis not present

## 2019-09-26 DIAGNOSIS — Z113 Encounter for screening for infections with a predominantly sexual mode of transmission: Secondary | ICD-10-CM

## 2019-09-26 DIAGNOSIS — D709 Neutropenia, unspecified: Secondary | ICD-10-CM

## 2019-09-26 LAB — VITAMIN B12: Vitamin B-12: 332 pg/mL (ref 211–911)

## 2019-09-26 LAB — FOLATE: Folate: 12.9 ng/mL (ref >5.9)

## 2019-09-29 LAB — HIV ANTIBODY (ROUTINE TESTING W REFLEX): HIV 1&2 Ab, 4th Generation: NONREACTIVE

## 2019-09-29 LAB — HEPATITIS C ANTIBODY
Hepatitis C Ab: NONREACTIVE
SIGNAL TO CUT-OFF: 0.01 (ref <1.00)

## 2019-10-12 ENCOUNTER — Ambulatory Visit
Admission: RE | Admit: 2019-10-12 | Discharge: 2019-10-12 | Disposition: A | Discharge status: Home / Self Care | Payer: 59 | Source: Ambulatory Visit | Attending: Family Medicine | Admitting: Family Medicine

## 2019-10-12 ENCOUNTER — Other Ambulatory Visit: Payer: Self-pay

## 2019-10-12 DIAGNOSIS — M545 Low back pain, unspecified: Secondary | ICD-10-CM

## 2019-10-13 NOTE — Progress Notes (Signed)
MRI shows central disc protrusion at L4-5 without obviously impacting nerves.  Facet arthritis is present.  Please schedule follow-up appointment with me to discuss MRI results.

## 2019-10-15 ENCOUNTER — Encounter: Payer: Self-pay | Admitting: Family Medicine

## 2019-10-15 ENCOUNTER — Other Ambulatory Visit: Payer: Self-pay

## 2019-10-15 ENCOUNTER — Ambulatory Visit: Payer: 59 | Admitting: Family Medicine

## 2019-10-15 VITALS — BP 110/78 | HR 62 | Ht 70.0 in | Wt 223.0 lb

## 2019-10-15 DIAGNOSIS — G8929 Other chronic pain: Secondary | ICD-10-CM

## 2019-10-15 DIAGNOSIS — M545 Low back pain: Secondary | ICD-10-CM

## 2019-10-15 NOTE — Patient Instructions (Signed)
Thank you for coming in today. You do have some causes of pain in your back.  It is hard to tell if that is what was actually causing the pain.  Could proceed to injections in the future if needed.

## 2019-10-15 NOTE — Progress Notes (Signed)
I, Wendy Poet, LAT, ATC, am serving as scribe for Dr. Lynne Leader.  Kevin [name] is a 44 y.o. male who presents to [place] [facility] today for f/u of L-sided LBP that initially began after picking up a barbell at the gym and for L-spine MRI review.  He was last seen by Dr. Georgina Snell on 09/23/19 and had a recent flare-up in his pain after playing tennis w/ his son.  Pt has completed PT in the past and has been performing his HEP per PT.  Since his last visit, pt reports that his low back has been feeling better over the past week.  He has had some additional dry needling and some massages.  Diagnostic testing: L-spine XR- 08/12/19; L-spine MRI- 10/12/19  Pertinent review of systems: No fevers or chills  Relevant historical information: Vitamin D deficiency, Mnire's disease   Exam:  BP 110/78 (BP Location: Right Arm, Patient Position: Sitting, Cuff Size: Large)   Pulse 62   Ht 5\' 10"  (1.778 m)   Wt 223 lb (101.2 kg)   SpO2 96%   BMI 32.00 kg/m  General: Well Developed, well nourished, and in no acute distress.   MSK: L-spine normal-appearing nontender normal motion normal gait    Lab and Radiology Results No results found for this or any previous visit (from the past 72 hour(s)). MR Lumbar Spine Wo Contrast  Result Date: 10/12/2019 CLINICAL DATA:  Initial evaluation for lower back pain for past 3 months, greater on the left. EXAM: MRI LUMBAR SPINE WITHOUT CONTRAST TECHNIQUE: Multiplanar, multisequence MR imaging of the lumbar spine was performed. No intravenous contrast was administered. COMPARISON:  Prior radiograph from 08/12/2019. FINDINGS: Segmentation: Standard. Lowest well-formed disc space labeled the L5-S1 level. Alignment: Physiologic with preservation of the normal lumbar lordosis. No listhesis. Vertebrae: Vertebral body height maintained without evidence for acute or chronic fracture. Bone marrow signal intensity within normal limits without  discrete or worrisome osseous lesions. Mild reactive endplate changes noted about the T12-L1 interspace. No abnormal marrow edema. Conus medullaris and cauda equina: Conus extends to the T12 level. Conus and cauda equina appear normal. Paraspinal and other soft tissues: Paraspinous soft tissues within normal limits. Visualized visceral structures are normal. Disc levels: T12-L1: Mild diffuse disc bulge with disc desiccation and intervertebral disc space narrowing. Mild reactive endplate changes with marginal endplate spurring. No canal or foraminal stenosis. L1-2:  Unremarkable. L2-3: Normal interspace. Mild facet hypertrophy. No canal or foraminal stenosis. L3-4: Normal interspace. Mild bilateral facet hypertrophy with ligament flavum thickening. No significant spinal stenosis. Foramina remain patent. L4-5: Disc desiccation. Shallow central disc protrusion minimally indents the ventral thecal sac. Mild to moderate bilateral facet hypertrophy. Resultant mild narrowing of the lateral recesses without overt neural impingement. Central canal and foramina remain patent. L5-S1:  Unremarkable. IMPRESSION: 1. Shallow central disc protrusion at L4-5 with resultant mild narrowing of the lateral recesses. No frank neural impingement. 2. Mild noncompressive disc bulging at T12-L1 without stenosis. 3. Mild bilateral facet hypertrophy at L2-3 through L4-5. Electronically Signed   By: Jeannine Boga M.D.   On: 10/12/2019 18:56     I, Lynne Leader, personally (independently) visualized and performed the interpretation of the images attached in this note.   Assessment and Plan: 44 y.o. male with low back pain.  Patient has some potential causes of back pain including mild disc desiccation and bulging as well as mild facet DJD.  However his pain has improved with a bit more time.  At this point no need for further interventions however if pain returns could proceed with facet injections or more physical  therapy.   Discussed warning signs or symptoms. Please see discharge instructions. Patient expresses understanding.   The above documentation has been reviewed and is accurate and complete [name], M.D.   Total encounter time 20 minutes including charting time date of service. MRI findings diagnosis treatment plan and options

## 2019-11-04 ENCOUNTER — Ambulatory Visit (INDEPENDENT_AMBULATORY_CARE_PROVIDER_SITE_OTHER): Payer: 59 | Admitting: Internal Medicine

## 2019-11-04 ENCOUNTER — Encounter: Payer: Self-pay | Admitting: Internal Medicine

## 2019-11-04 ENCOUNTER — Other Ambulatory Visit: Payer: Self-pay

## 2019-11-04 VITALS — BP 124/82 | HR 65 | Temp 97.4°F | Ht 70.47 in | Wt 222.8 lb

## 2019-11-04 DIAGNOSIS — F419 Anxiety disorder, unspecified: Secondary | ICD-10-CM

## 2019-11-04 DIAGNOSIS — R6882 Decreased libido: Secondary | ICD-10-CM | POA: Diagnosis not present

## 2019-11-04 DIAGNOSIS — Z1211 Encounter for screening for malignant neoplasm of colon: Secondary | ICD-10-CM

## 2019-11-04 DIAGNOSIS — E669 Obesity, unspecified: Secondary | ICD-10-CM

## 2019-11-04 DIAGNOSIS — F329 Major depressive disorder, single episode, unspecified: Secondary | ICD-10-CM

## 2019-11-04 DIAGNOSIS — E782 Mixed hyperlipidemia: Secondary | ICD-10-CM

## 2019-11-04 DIAGNOSIS — N3281 Overactive bladder: Secondary | ICD-10-CM

## 2019-11-04 DIAGNOSIS — Z0000 Encounter for general adult medical examination without abnormal findings: Secondary | ICD-10-CM | POA: Diagnosis not present

## 2019-11-04 DIAGNOSIS — G4700 Insomnia, unspecified: Secondary | ICD-10-CM

## 2019-11-04 DIAGNOSIS — F32A Depression, unspecified: Secondary | ICD-10-CM

## 2019-11-04 DIAGNOSIS — R350 Frequency of micturition: Secondary | ICD-10-CM

## 2019-11-04 DIAGNOSIS — F439 Reaction to severe stress, unspecified: Secondary | ICD-10-CM

## 2019-11-04 MED ORDER — FLUOXETINE HCL 20 MG PO TABS: 20.0000 mg | ORAL_TABLET | Freq: Every day | ORAL | 3 refills | Status: DC

## 2019-11-04 NOTE — Progress Notes (Signed)
Patient wanting to discuss: Sleep issues Anxiety Urinary frequency, having to go with being unable to hold it. States his stream is steady when he does go.  Testosterone therapy, states his muscle mass is decreasing, low energy, and slight erectile dysfunction/sexual stamina.

## 2019-11-04 NOTE — Patient Instructions (Addendum)
Stress relax brand: Tranquil Sleep (amazon or [employer] foods)  Melatonin, serotonin and L theanine    Dr. Berneice Heinrich, Dr. Annabell Howells, Dr. Laverle Patter or Dr. Mena Goes (urology) [facility] in [place]    Statin medications low to high intensity: Pravachol/Lovastatin then Zocor then lipitor and Crestor   Let me know which one you want to take   Vitamin D3 2000 to 4000 IU daily max 4000 IU daily    [facility] Dr. [facility]   Results for Kevin [name], Kevin [name]" (MRN [id number]) as of 11/04/2019 16:16  Ref. Range 09/23/2019 08:07  PSA Latest Ref Range: 0.10 - 4.00 ng/mL 0.86    Simvastatin tablets What is this medicine? SIMVASTATIN (SIM va stat in) is known as a HMG-CoA reductase inhibitor or 'statin'. It lowers the level of cholesterol and triglycerides in the blood. This drug may also reduce the risk of heart attack, stroke, or other health problems in patients with risk factors for heart or blood vessel disease. Diet and lifestyle changes are often used with this drug. This medicine may be used for other purposes; ask your health care provider or pharmacist if you have questions. COMMON BRAND NAME(S): Zocor What should I tell my health care provider before I take this medicine? They need to know if you have any of these conditions:  diabetes  if you often drink alcohol  history of stroke  kidney disease  liver disease  muscle aches or weakness  thyroid disease  an unusual or allergic reaction to simvastatin, other medicines, foods, dyes, or preservatives  pregnant or trying to get pregnant  breast-feeding How should I use this medicine? Take this medicine by mouth with a glass of water. Follow the directions on the prescription label. You can take this medicine with or without food. Take your doses at regular intervals. Do not take your medicine more often than directed. Talk to your pediatrician regarding the use of this medicine in children. Special care may be  needed. Overdosage: If you think you have taken too much of this medicine contact a poison control center or emergency room at once. NOTE: This medicine is only for you. Do not share this medicine with others. What if I miss a dose? If you miss a dose, take it as soon as you can. If it is almost time for your next dose, take only that dose. Do not take double or extra doses. What may interact with this medicine? Do not take this medicine with any of the following medications:  boceprevir  cyclosporine  danazol  gemfibrozil  medicines for fungal infections like itraconazole, ketoconazole, posaconazole, voriconazole  medicines for HIV infection  mifepristone, RU-486  nefazodone  red yeast rice  some antibiotics like clarithromycin, erythromycin, telithromycin  telaprevir This medicine may also interact with the following medications:  alcohol  amiodarone  amlodipine  colchicine  digoxin  diltiazem  dronedarone  fluconazole  grapefruit juice  niacin  ranolazine  verapamil  warfarin This list may not describe all possible interactions. Give your health care provider a list of all the medicines, herbs, non-prescription drugs, or dietary supplements you use. Also tell them if you smoke, drink alcohol, or use illegal drugs. Some items may interact with your medicine. What should I watch for while using this medicine? Visit your doctor or health care professional for regular check-ups. You may need regular tests to make sure your liver is working properly. Your health care professional may tell you to stop taking this medicine if  you develop muscle problems. If your muscle problems do not go away after stopping this medicine, contact your health care professional. Do not become pregnant while taking this medicine. Women should inform their health care professional if they wish to become pregnant or think they might be pregnant. There is a potential for serious side  effects to an unborn child. Talk to your health care professional or pharmacist for more information. Do not breast-feed an infant while taking this medicine. This medicine may increase blood sugar. Ask your healthcare provider if changes in diet or medicines are needed if you have diabetes. If you are going to need surgery or other procedure, tell your doctor that you are using this medicine. This drug is only part of a total heart-health program. Your doctor or a dietician can suggest a low-cholesterol and low-fat diet to help. Avoid alcohol and smoking, and keep a proper exercise schedule. This medicine may cause a decrease in Co-Enzyme Q-10. You should make sure that you get enough Co-Enzyme Q-10 while you are taking this medicine. Discuss the foods you eat and the vitamins you take with your health care professional. What side effects may I notice from receiving this medicine? Side effects that you should report to your doctor or health care professional as soon as possible:  allergic reactions like skin rash, itching or hives, swelling of the face, lips, or tongue  confusion  joint pain  loss of memory  redness, blistering, peeling or loosening of the skin, including inside the mouth  signs and symptoms of high blood sugar such as being more thirsty or hungry or having to urinate more than normal. You may also feel very tired or have blurry vision.  signs and symptoms of muscle injury like dark urine; trouble passing urine or change in the amount of urine; unusually weak or tired; muscle pain or side or back pain  yellowing of the eyes or skin Side effects that usually do not require medical attention (report to your doctor or health care professional if they continue or are bothersome):  constipation  diarrhea  dizziness  gas  headache  nausea  stomach pain  trouble sleeping  upset stomach This list may not describe all possible side effects. Call your doctor for  medical advice about side effects. You may report side effects to FDA at 1-800-FDA-1088. Where should I keep my medicine? Keep out of the reach of children. Store at room temperature below 30 degrees C (86 degrees F). Keep container tightly closed. Throw away any unused medicine after the expiration date. NOTE: This sheet is a summary. It may not cover all possible information. If you have questions about this medicine, talk to your doctor, pharmacist, or health care provider.  2020 Elsevier/Gold Standard (2018-02-28 08:46:02)  Pravastatin tablets What is this medicine? PRAVASTATIN (PRA va stat in) is known as a HMG-CoA reductase inhibitor or 'statin'. It lowers the level of cholesterol and triglycerides in the blood. This drug may also reduce the risk of heart attack, stroke, or other health problems in patients with risk factors for heart disease. Diet and lifestyle changes are often used with this drug. This medicine may be used for other purposes; ask your health care provider or pharmacist if you have questions. COMMON BRAND NAME(S): Pravachol What should I tell my health care provider before I take this medicine? They need to know if you have any of these conditions:  diabetes  if you often drink alcohol  history of stroke  kidney disease  liver disease  muscle aches or weakness  thyroid disease  an unusual or allergic reaction to pravastatin, other medicines, foods, dyes, or preservatives  pregnant or trying to get pregnant  breast-feeding How should I use this medicine? Take pravastatin tablets by mouth. Swallow the tablets with a drink of water. Pravastatin can be taken at anytime of the day, with or without food. Follow the directions on the prescription label. Take your doses at regular intervals. Do not take your medicine more often than directed. Talk to your pediatrician regarding the use of this medicine in children. Special care may be needed. Pravastatin has been  used in children as young as 238 years of age. Overdosage: If you think you have taken too much of this medicine contact a poison control center or emergency room at once. NOTE: This medicine is only for you. Do not share this medicine with others. What if I miss a dose? If you miss a dose, take it as soon as you can. If it is almost time for your next dose, take only that dose. Do not take double or extra doses. What may interact with this medicine? This medicine may interact with the following medications:  colchicine  cyclosporine  other medicines for high cholesterol  some antibiotics like azithromycin, clarithromycin, erythromycin, and telithromycin This list may not describe all possible interactions. Give your health care provider a list of all the medicines, herbs, non-prescription drugs, or dietary supplements you use. Also tell them if you smoke, drink alcohol, or use illegal drugs. Some items may interact with your medicine. What should I watch for while using this medicine? Visit your doctor or health care professional for regular check-ups. You may need regular tests to make sure your liver is working properly. Your health care professional may tell you to stop taking this medicine if you develop muscle problems. If your muscle problems do not go away after stopping this medicine, contact your health care professional. Do not become pregnant while taking this medicine. Women should inform their health care professional if they wish to become pregnant or think they might be pregnant. There is a potential for serious side effects to an unborn child. Talk to your health care professional or pharmacist for more information. Do not breast-feed an infant while taking this medicine. This medicine may affect blood sugar levels. If you have diabetes, check with your doctor or health care professional before you change your diet or the dose of your diabetic medicine. If you are going to need  surgery or other procedure, tell your doctor that you are using this medicine. This drug is only part of a total heart-health program. Your doctor or a dietician can suggest a low-cholesterol and low-fat diet to help. Avoid alcohol and smoking, and keep a proper exercise schedule. This medicine may cause a decrease in Co-Enzyme Q-10. You should make sure that you get enough Co-Enzyme Q-10 while you are taking this medicine. Discuss the foods you eat and the vitamins you take with your health care professional. What side effects may I notice from receiving this medicine? Side effects that you should report to your doctor or health care professional as soon as possible:  allergic reactions like skin rash, itching or hives, swelling of the face, lips, or tongue  dark urine  fever  muscle pain, cramps, or weakness  redness, blistering, peeling or loosening of the skin, including inside the mouth  trouble passing urine or change in the amount  of urine  unusually weak or tired  yellowing of the eyes or skin Side effects that usually do not require medical attention (report to your doctor or health care professional if they continue or are bothersome):  gas  headache  heartburn  indigestion  stomach pain This list may not describe all possible side effects. Call your doctor for medical advice about side effects. You may report side effects to FDA at 1-800-FDA-1088. Where should I keep my medicine? Keep out of the reach of children. Store at room temperature between 15 to 30 degrees C (59 to 86 degrees F). Protect from light. Keep container tightly closed. Throw away any unused medicine after the expiration date. NOTE: This sheet is a summary. It may not cover all possible information. If you have questions about this medicine, talk to your doctor, pharmacist, or health care provider.  2020 Elsevier/Gold Standard (2017-01-09 12:37:09)     Mindfulness-Based Stress  Reduction Mindfulness-based stress reduction (MBSR) is a program that helps people learn to practice mindfulness. Mindfulness is the practice of intentionally paying attention to the present moment. It can be learned and practiced through techniques such as education, breathing exercises, meditation, and yoga. MBSR includes several mindfulness techniques in one program. MBSR works best when you understand the treatment, are willing to try new things, and can commit to spending time practicing what you learn. MBSR training may include learning about:  How your emotions, thoughts, and reactions affect your body.  New ways to respond to things that cause negative thoughts to start (triggers).  How to notice your thoughts and let go of them.  Practicing awareness of everyday things that you normally do without thinking.  The techniques and goals of different types of meditation. What are the benefits of MBSR? MBSR can have many benefits, which include helping you to:  Develop self-awareness. This refers to knowing and understanding yourself.  Learn skills and attitudes that help you to participate in your own health care.  Learn new ways to care for yourself.  Be more accepting about how things are, and let things go.  Be less judgmental and approach things with an open mind.  Be patient with yourself and trust yourself more. MBSR has also been shown to:  Reduce negative emotions, such as depression and anxiety.  Improve memory and focus.  Change how you sense and approach pain.  Boost your body's ability to fight infections.  Help you connect better with other people.  Improve your sense of well-being. Follow these instructions at home:   Find a local in-person or online MBSR program.  Set aside some time regularly for mindfulness practice.  Find a mindfulness practice that works best for you. This may include one or more of the following: ? Meditation. Meditation  involves focusing your mind on a certain thought or activity. ? Breathing awareness exercises. These help you to stay present by focusing on your breath. ? Body scan. For this practice, you lie down and pay attention to each part of your body from head to toe. You can identify tension and soreness and intentionally relax parts of your body. ? Yoga. Yoga involves stretching and breathing, and it can improve your ability to move and be flexible. It can also provide an experience of testing your body's limits, which can help you release stress. ? Mindful eating. This way of eating involves focusing on the taste, texture, color, and smell of each bite of food. Because this slows down eating and helps  you feel full sooner, it can be an important part of a weight-loss plan.  Find a podcast or recording that provides guidance for breathing awareness, body scan, or meditation exercises. You can listen to these any time when you have a free moment to rest without distractions.  Follow your treatment plan as told by your health care provider. This may include taking regular medicines and making changes to your diet or lifestyle as recommended. How to practice mindfulness To do a basic awareness exercise:  Find a comfortable place to sit.  Pay attention to the present moment. Observe your thoughts, feelings, and surroundings just as they are.  Avoid placing judgment on yourself, your feelings, or your surroundings. Make note of any judgment that comes up, and let it go.  Your mind may wander, and that is okay. Make note of when your thoughts drift, and return your attention to the present moment. To do basic mindfulness meditation:  Find a comfortable place to sit. This may include a stable chair or a firm floor cushion. ? Sit upright with your back straight. Let your arms fall next to your side with your hands resting on your legs. ? If sitting in a chair, rest your feet flat on the floor. ? If sitting  on a cushion, cross your legs in front of you.  Keep your head in a neutral position with your chin dropped slightly. Relax your jaw and rest the tip of your tongue on the roof of your mouth. Drop your gaze to the floor. You can close your eyes if you like.  Breathe normally and pay attention to your breath. Feel the air moving in and out of your nose. Feel your belly expanding and relaxing with each breath.  Your mind may wander, and that is okay. Make note of when your thoughts drift, and return your attention to your breath.  Avoid placing judgment on yourself, your feelings, or your surroundings. Make note of any judgment or feelings that come up, let them go, and bring your attention back to your breath.  When you are ready, lift your gaze or open your eyes. Pay attention to how your body feels after the meditation. Where to find more information You can find more information about MBSR from:  Your health care provider.  Community-based meditation centers or programs.  Programs offered near you. Summary  Mindfulness-based stress reduction (MBSR) is a program that teaches you how to intentionally pay attention to the present moment. It is used with other treatments to help you cope better with daily stress, emotions, and pain.  MBSR focuses on developing self-awareness, which allows you to respond to life stress without judgment or negative emotions.  MBSR programs may involve learning different mindfulness practices, such as breathing exercises, meditation, yoga, body scan, or mindful eating. Find a mindfulness practice that works best for you, and set aside time for it on a regular basis. This information is not intended to replace advice given to you by your health care provider. Make sure you discuss any questions you have with your health care provider. Document Revised: 04/20/2017 Document Reviewed: 09/14/2016 Elsevier Patient Education  2020 [employer].

## 2019-11-04 NOTE — Progress Notes (Signed)
Chief Complaint  Patient presents with  . Annual Exam   Annual  1. C/o snoring with sleeping on his back  2. HLD and elevated trigs his dad takes cholesterol med and tolerates and he is agreeable after doing research about side effects. He is working out and trying to eat right 3. Reduced libido and overactive bladder problems holding urine wants to see urology. He does drink caffeine last drinks coffee drink 2-3 oclock and drinks 1 to 1.5 gallon water daily  Testosterone 12/23/15 461 normal Results for [name], [name]" (MRN [id number]) as of 11/05/2019 12:57  09/23/2019 08:07 PSA: 0.86  4. 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 No help with melatonin in the past, trazadone 50-100 takes only prn. He is on prozac 10 mg qd at times he does miss doses and takes trazadone prn  He f/u with therapy Mrs. Cottle x 3-4 years Q2-3 months GAD 7 6 today  Anxiety/sleep worse x 2 weeks going to bed 10 pm wakes up 2-3 am and stays up to 5 or 6 am if he cant fall asleep due to thinking about work so getting 4-5 hrs of sleep before interruption    Review of Systems  Constitutional: Negative for weight loss.  HENT: Negative for hearing loss.   Eyes: Negative for blurred vision.  Respiratory: Negative for shortness of breath.   Cardiovascular: Negative for chest pain.  Gastrointestinal: Negative for abdominal pain.  Musculoskeletal: Negative for falls.  Skin: Negative for rash.  Neurological: Negative for headaches.       +snoring   Psychiatric/Behavioral: The patient is nervous/anxious and has insomnia.    Past Medical History:  Diagnosis Date  . Allergy   . Anxiety   . Chicken pox   . Insomnia   . Shoulder pain   . Vitamin D deficiency    Past Surgical History:  Procedure Laterality Date  . ROTATOR CUFF REPAIR  2015  . VASECTOMY  2014   Family History  Problem Relation Age of Onset  . Hypertension Mother   . Hyperlipidemia Father   .  Hyperlipidemia Maternal Grandmother   . Hypertension Maternal Grandmother   . Arthritis Maternal Grandmother    Social History   Socioeconomic History  . Marital status: Married    Spouse name: Not on file  . Number of children: Not on file  . Years of education: Not on file  . Highest education level: Not on file  Occupational History  . Not on file  Tobacco Use  . Smoking status: Never Smoker  . Smokeless tobacco: Never Used  Substance and Sexual Activity  . Alcohol use: Yes    Alcohol/week: 10.0 standard drinks    Types: 10 Cans of beer per week  . Drug use: Yes    Comment: acid- teenager  . Sexual activity: Yes    Birth control/protection: None  Other Topics Concern  . Not on file  Social History Narrative   Married    Engineer, civil (consulting)   Social Determinants of Health   Financial Resource Strain:   . Difficulty of Paying Living Expenses:   Food Insecurity:   . Worried About Charity fundraiser in the Last Year:   . Arboriculturist in the Last Year:   Transportation Needs:   . Film/video editor (Medical):   Marland Kitchen Lack of Transportation (Non-Medical):   Physical Activity:   . Days of Exercise per Week:   .  Minutes of Exercise per Session:   Stress:   . Feeling of Stress :   Social Connections:   . Frequency of Communication with Friends and Family:   . Frequency of Social Gatherings with Friends and Family:   . Attends Religious Services:   . Active Member of Clubs or Organizations:   . Attends Archivist Meetings:   Marland Kitchen Marital Status:   Intimate Partner Violence:   . Fear of Current or Ex-Partner:   . Emotionally Abused:   Marland Kitchen Physically Abused:   . Sexually Abused:    Current Meds  Medication Sig  . FLUoxetine (PROZAC) 20 MG tablet Take 1 tablet (20 mg total) by mouth daily.  . traZODone (DESYREL) 50 MG tablet TAKE 0.5-1 TABLETS (25-50 MG TOTAL) BY MOUTH AT BEDTIME AS NEEDED FOR SLEEP.  . [DISCONTINUED] FLUoxetine (PROZAC) 10 MG tablet Take  1 tablet (10 mg total) by mouth daily.   No Known Allergies Recent Results (from the past 2160 hour(s))  Comprehensive metabolic panel     Status: Abnormal   Collection Time: 09/23/19  8:07 AM  Result Value Ref Range   Sodium 139 135 - 145 mEq/L   Potassium 4.6 3.5 - 5.1 mEq/L   Chloride 104 96 - 112 mEq/L   CO2 29 19 - 32 mEq/L   Glucose, Bld 105 (H) 70 - 99 mg/dL   BUN 16 6 - 23 mg/dL   Creatinine, Ser 1.18 0.40 - 1.50 mg/dL   Total Bilirubin 0.5 0.2 - 1.2 mg/dL   Alkaline Phosphatase 94 39 - 117 U/L   AST 21 0 - 37 U/L   ALT 27 0 - 53 U/L   Total Protein 6.8 6.0 - 8.3 g/dL   Albumin 4.3 3.5 - 5.2 g/dL   GFR 66.49 >60.00 mL/min   Calcium 9.1 8.4 - 10.5 mg/dL  Lipid panel     Status: Abnormal   Collection Time: 09/23/19  8:07 AM  Result Value Ref Range   Cholesterol 219 (H) 0 - 200 mg/dL    Comment: ATP III Classification       Desirable:  < 200 mg/dL               Borderline High:  200 - 239 mg/dL          High:  > = 240 mg/dL   Triglycerides 251.0 (H) 0 - 149 mg/dL    Comment: Normal:  <150 mg/dLBorderline High:  150 - 199 mg/dL   HDL 36.70 (L) >39.00 mg/dL   VLDL 50.2 (H) 0.0 - 40.0 mg/dL   Total CHOL/HDL Ratio 6     Comment:                Men          Women1/2 Average Risk     3.4          3.3Average Risk          5.0          4.42X Average Risk          9.6          7.13X Average Risk          15.0          11.0                       NonHDL 182.67     Comment: NOTE:  Non-HDL goal should be 30 mg/dL higher than  patient's LDL goal (i.e. LDL goal of < 70 mg/dL, would have non-HDL goal of < 100 mg/dL)  CBC with Differential/Platelet     Status: Abnormal   Collection Time: 09/23/19  8:07 AM  Result Value Ref Range   WBC 4.9 4.0 - 10.5 K/uL   RBC 4.70 4.22 - 5.81 Mil/uL   Hemoglobin 15.8 13.0 - 17.0 g/dL   HCT 45.2 39 - 52 %   MCV 96.3 78.0 - 100.0 fl   MCHC 34.9 30.0 - 36.0 g/dL   RDW 12.5 11.5 - 15.5 %   Platelets 240.0 150 - 400 K/uL   Neutrophils Relative % 36.7 (L)  43 - 77 %   Lymphocytes Relative 43.0 12 - 46 %   Monocytes Relative 15.1 (H) 3 - 12 %   Eosinophils Relative 4.4 0 - 5 %   Basophils Relative 0.8 0 - 3 %   Neutro Abs 1.8 1.4 - 7.7 K/uL   Lymphs Abs 2.1 0.7 - 4.0 K/uL   Monocytes Absolute 0.7 0 - 1 K/uL   Eosinophils Absolute 0.2 0 - 0 K/uL   Basophils Absolute 0.0 0 - 0 K/uL  TSH     Status: None   Collection Time: 09/23/19  8:07 AM  Result Value Ref Range   TSH 1.59 0.35 - 4.50 uIU/mL  Urinalysis, Routine w reflex microscopic     Status: None   Collection Time: 09/23/19  8:07 AM  Result Value Ref Range   Color, Urine YELLOW Yellow;Lt. Yellow;Straw;Dark Yellow;Amber;Green;Red;Brown   APPearance CLEAR Clear;Turbid;Slightly Cloudy;Cloudy   Specific Gravity, Urine 1.020 1.000 - 1.030   pH 6.5 5.0 - 8.0   Total Protein, Urine NEGATIVE Negative   Urine Glucose NEGATIVE Negative   Ketones, ur NEGATIVE Negative   Bilirubin Urine NEGATIVE Negative   Hgb urine dipstick NEGATIVE Negative   Urobilinogen, UA 0.2 0.0 - 1.0   Leukocytes,Ua NEGATIVE Negative   Nitrite NEGATIVE Negative   WBC, UA 0-2/hpf 0-2/hpf   RBC / HPF none seen 0-2/hpf   Squamous Epithelial / LPF Rare(0-4/hpf) Rare(0-4/hpf)  PSA     Status: None   Collection Time: 09/23/19  8:07 AM  Result Value Ref Range   PSA 0.86 0.10 - 4.00 ng/mL    Comment: Test performed using Access Hybritech PSA Assay, a parmagnetic partical, chemiluminecent immunoassay.  Vitamin D (25 hydroxy)     Status: Abnormal   Collection Time: 09/23/19  8:07 AM  Result Value Ref Range   VITD 28.62 (L) 30.00 - 100.00 ng/mL  Hepatitis B surface antibody,quantitative     Status: None   Collection Time: 09/23/19  8:07 AM  Result Value Ref Range   Hepatitis B-Post >1,000 > OR = 10 mIU/mL    Comment: . Patient has immunity to hepatitis B virus. . For additional information, please refer to http://education.[facility].com/faq/FAQ105 (This link is being provided for informational/ educational  purposes only).   LDL cholesterol, direct     Status: None   Collection Time: 09/23/19  8:07 AM  Result Value Ref Range   Direct LDL 118.0 mg/dL    Comment: Optimal:  <100 mg/dLNear or Above Optimal:  100-129 mg/dLBorderline High:  130-159 mg/dLHigh:  160-189 mg/dLVery High:  >190 mg/dL  Folate     Status: None   Collection Time: 09/26/19  9:46 AM  Result Value Ref Range   Folate 12.9 >5.9 ng/mL  B12     Status: None   Collection Time: 09/26/19  9:46 AM  Result Value Ref  Range   Vitamin B-12 332 211 - 911 pg/mL  Hepatitis C antibody     Status: None   Collection Time: 09/26/19  9:46 AM  Result Value Ref Range   Hepatitis C Ab NON-REACTIVE NON-REACTI   SIGNAL TO CUT-OFF 0.01 <1.00    Comment: . HCV antibody was non-reactive. There is no laboratory  evidence of HCV infection. . In most cases, no further action is required. However, if recent HCV exposure is suspected, a test for HCV RNA (test code 8282559620) is suggested. . For additional information please refer to http://education.[facility].com/faq/FAQ22v1 (This link is being provided for informational/ educational purposes only.) .   HIV antibody (with reflex)     Status: None   Collection Time: 09/26/19  9:46 AM  Result Value Ref Range   HIV 1&2 Ab, 4th Generation NON-REACTIVE NON-REACTI    Comment: HIV-1 antigen and HIV-1/HIV-2 antibodies were not detected. There is no laboratory evidence of HIV infection. Marland Kitchen PLEASE NOTE: This information has been disclosed to you from records whose confidentiality may be protected by state law.  If your state requires such protection, then the state law prohibits you from making any further disclosure of the information without the specific written consent of the person to whom it pertains, or as otherwise permitted by law. A general authorization for the release of medical or other information is NOT sufficient for this purpose. . For additional information please refer  to http://education.[facility].com/faq/FAQ106 (This link is being provided for informational/ educational purposes only.) . Marland Kitchen The performance of this assay has not been clinically validated in patients less than 59 years old. .    Objective  Body mass index is 31.54 kg/m. Wt Readings from Last 3 Encounters:  11/04/19 222 lb 12.8 oz (101.1 kg)  10/15/19 223 lb (101.2 kg)  09/23/19 219 lb 6.4 oz (99.5 kg)   Temp Readings from Last 3 Encounters:  11/04/19 (!) 97.4 F (36.3 C) (Temporal)  05/29/18 97.7 F (36.5 C) (Oral)  12/11/17 98.6 F (37 C) (Oral)   BP Readings from Last 3 Encounters:  11/04/19 124/82  10/15/19 110/78  09/23/19 110/76   Pulse Readings from Last 3 Encounters:  11/04/19 65  10/15/19 62  09/23/19 63    Physical Exam Vitals and nursing note reviewed.  Constitutional:      Appearance: Normal appearance. He is well-developed and well-groomed.  HENT:     Head: Normocephalic and atraumatic.  Eyes:     Conjunctiva/sclera: Conjunctivae normal.     Pupils: Pupils are equal, round, and reactive to light.  Cardiovascular:     Rate and Rhythm: Normal rate and regular rhythm.     Heart sounds: Normal heart sounds. No murmur heard.   Pulmonary:     Effort: Pulmonary effort is normal.     Breath sounds: Normal breath sounds.  Abdominal:     General: Abdomen is flat. Bowel sounds are normal.  Skin:    General: Skin is warm and dry.  Neurological:     General: No focal deficit present.     Mental Status: He is alert and oriented to person, place, and time. Mental status is at baseline.     Gait: Gait normal.  Psychiatric:        Attention and Perception: Attention and perception normal.        Mood and Affect: Mood and affect normal.        Speech: Speech normal.        Behavior: Behavior normal.  Behavior is cooperative.        Thought Content: Thought content normal.        Cognition and Memory: Cognition and memory normal.        Judgment:  Judgment normal.     Assessment  Plan  Annual physical exam utd flu UTD Tdap MMR immune Hep B 2/2 need protected hep B  Cont exercise to 2x per week healthy diet choices  No need for dermatology now benign nevi Colonoscopy referred [facility] Dr. Alice Reichert   09/23/19 normal PSA referred urology   Depression, unspecified depression type - Plan: FLUoxetine (PROZAC) 20 MG tablet increased from 10 mg  Anxiety - Plan: FLUoxetine (PROZAC) 20 MG tablet stress Insomnia -prn Trazadone f/u with therapy   Reduced libido - Plan: Ambulatory referral to Urology Urinary frequency - Plan: Ambulatory referral to Urology Overactive bladder - Plan: Ambulatory referral to Urology  HLD  Let me know which statin agreeable to try CC my chart back after review side effects  Provider: Dr. Olivia Mackie McLean-Scocuzza-[facility]

## 2019-11-05 ENCOUNTER — Encounter: Payer: Self-pay | Admitting: Internal Medicine

## 2019-11-05 DIAGNOSIS — F439 Reaction to severe stress, unspecified: Secondary | ICD-10-CM | POA: Insufficient documentation

## 2019-11-05 DIAGNOSIS — E669 Obesity, unspecified: Secondary | ICD-10-CM | POA: Insufficient documentation

## 2019-11-05 MED ORDER — LOVASTATIN 20 MG PO TABS: 20.0000 mg | ORAL_TABLET | Freq: Every day | ORAL | 3 refills | Status: DC

## 2019-11-05 NOTE — Addendum Note (Signed)
Addended by: Quentin Ore on: 11/05/2019 01:24 PM   Modules accepted: Orders

## 2020-01-13 ENCOUNTER — Ambulatory Visit (INDEPENDENT_AMBULATORY_CARE_PROVIDER_SITE_OTHER): Payer: 59 | Admitting: Psychology

## 2020-01-13 DIAGNOSIS — F411 Generalized anxiety disorder: Secondary | ICD-10-CM | POA: Diagnosis not present

## 2020-02-26 ENCOUNTER — Telehealth: Payer: Self-pay | Admitting: *Deleted

## 2020-02-26 NOTE — Telephone Encounter (Signed)
Please place future orders for lab appt.  

## 2020-02-27 ENCOUNTER — Other Ambulatory Visit: Payer: Self-pay

## 2020-02-27 ENCOUNTER — Other Ambulatory Visit: Payer: Self-pay | Admitting: Internal Medicine

## 2020-02-27 ENCOUNTER — Other Ambulatory Visit (INDEPENDENT_AMBULATORY_CARE_PROVIDER_SITE_OTHER): Payer: 59

## 2020-02-27 DIAGNOSIS — E559 Vitamin D deficiency, unspecified: Secondary | ICD-10-CM

## 2020-02-27 DIAGNOSIS — E785 Hyperlipidemia, unspecified: Secondary | ICD-10-CM | POA: Diagnosis not present

## 2020-02-27 LAB — COMPREHENSIVE METABOLIC PANEL
ALT: 29 U/L (ref 0–53)
AST: 27 U/L (ref 0–37)
Albumin: 4.4 g/dL (ref 3.5–5.2)
Alkaline Phosphatase: 82 U/L (ref 39–117)
BUN: 15 mg/dL (ref 6–23)
CO2: 29 mEq/L (ref 19–32)
Calcium: 9.2 mg/dL (ref 8.4–10.5)
Chloride: 103 mEq/L (ref 96–112)
Creatinine, Ser: 1.15 mg/dL (ref 0.40–1.50)
GFR: 75.71 mL/min (ref >60.00)
Glucose, Bld: 95 mg/dL (ref 70–99)
Potassium: 4.2 mEq/L (ref 3.5–5.1)
Sodium: 139 mEq/L (ref 135–145)
Total Bilirubin: 0.6 mg/dL (ref 0.2–1.2)
Total Protein: 7.3 g/dL (ref 6.0–8.3)

## 2020-02-27 LAB — LIPID PANEL
Cholesterol: 221 mg/dL — ABNORMAL HIGH (ref 0–200)
HDL: 37.3 mg/dL — ABNORMAL LOW (ref >39.00)
NonHDL: 184.04
Total CHOL/HDL Ratio: 6
Triglycerides: 296 mg/dL — ABNORMAL HIGH (ref 0.0–149.0)
VLDL: 59.2 mg/dL — ABNORMAL HIGH (ref 0.0–40.0)

## 2020-02-27 LAB — LDL CHOLESTEROL, DIRECT: Direct LDL: 120 mg/dL

## 2020-02-27 LAB — VITAMIN D 25 HYDROXY (VIT D DEFICIENCY, FRACTURES): VITD: 32.11 ng/mL (ref 30.00–100.00)

## 2020-03-01 ENCOUNTER — Encounter: Payer: Self-pay | Admitting: Internal Medicine

## 2020-03-01 ENCOUNTER — Other Ambulatory Visit: Payer: Self-pay

## 2020-03-01 DIAGNOSIS — E782 Mixed hyperlipidemia: Secondary | ICD-10-CM

## 2020-03-01 MED ORDER — SIMVASTATIN 40 MG PO TABS: 40.0000 mg | ORAL_TABLET | Freq: Every day | ORAL | 3 refills | Status: DC

## 2020-03-02 ENCOUNTER — Encounter: Payer: Self-pay | Admitting: Internal Medicine

## 2020-03-02 ENCOUNTER — Other Ambulatory Visit: Payer: Self-pay | Admitting: Internal Medicine

## 2020-03-02 ENCOUNTER — Telehealth (INDEPENDENT_AMBULATORY_CARE_PROVIDER_SITE_OTHER): Payer: 59 | Admitting: Internal Medicine

## 2020-03-02 ENCOUNTER — Other Ambulatory Visit
Admission: RE | Admit: 2020-03-02 | Discharge: 2020-03-02 | Disposition: A | Discharge status: Home / Self Care | Payer: 59 | Source: Ambulatory Visit | Attending: Internal Medicine | Admitting: Internal Medicine

## 2020-03-02 ENCOUNTER — Other Ambulatory Visit: Payer: Self-pay

## 2020-03-02 VITALS — Ht 70.47 in | Wt 210.0 lb

## 2020-03-02 DIAGNOSIS — R197 Diarrhea, unspecified: Secondary | ICD-10-CM | POA: Diagnosis not present

## 2020-03-02 DIAGNOSIS — B009 Herpesviral infection, unspecified: Secondary | ICD-10-CM

## 2020-03-02 LAB — C DIFFICILE QUICK SCREEN W PCR REFLEX
C Diff antigen: NEGATIVE
C Diff interpretation: NOT DETECTED
C Diff toxin: NEGATIVE

## 2020-03-02 MED ORDER — VALACYCLOVIR HCL 1 G PO TABS: 2000.0000 mg | ORAL_TABLET | Freq: Two times a day (BID) | ORAL | 3 refills | Status: DC

## 2020-03-02 NOTE — Patient Instructions (Signed)
Bland Diet A bland diet consists of foods that are often soft and do not have a lot of fat, fiber, or extra seasonings. Foods without fat, fiber, or seasoning are easier for the body to digest. They are also less likely to irritate your mouth, throat, stomach, and other parts of your digestive system. A bland diet is sometimes called a BRAT diet. What is my plan? Your health care provider or food and nutrition specialist (dietitian) may recommend specific changes to your diet to prevent symptoms or to treat your symptoms. These changes may include:  Eating small meals often.  Cooking food until it is soft enough to chew easily.  Chewing your food well.  Drinking fluids slowly.  Not eating foods that are very spicy, sour, or fatty.  Not eating citrus fruits, such as oranges and grapefruit. What do I need to know about this diet?  Eat a variety of foods from the bland diet food list.  Do not follow a bland diet longer than needed.  Ask your health care provider whether you should take vitamins or supplements. What foods can I eat? Grains  Hot cereals, such as cream of wheat. Rice. Bread, crackers, or tortillas made from refined white flour. Vegetables Canned or cooked vegetables. Mashed or boiled potatoes. Fruits  Bananas. Applesauce. Other types of cooked or canned fruit with the skin and seeds removed, such as canned peaches or pears. Meats and other proteins  Scrambled eggs. Creamy peanut butter or other nut butters. Lean, well-cooked meats, such as chicken or fish. Tofu. Soups or broths. Dairy Low-fat dairy products, such as milk, cottage cheese, or yogurt. Beverages  Water. Herbal tea. Apple juice. Fats and oils Mild salad dressings. Canola or olive oil. Sweets and desserts Pudding. Custard. Fruit gelatin. Ice cream. The items listed above may not be a complete list of recommended foods and beverages. Contact a dietitian for more options. What foods are not  recommended? Grains Whole grain breads and cereals. Vegetables Raw vegetables. Fruits Raw fruits, especially citrus, berries, or dried fruits. Dairy Whole fat dairy foods. Beverages Caffeinated drinks. Alcohol. Seasonings and condiments Strongly flavored seasonings or condiments. Hot sauce. Salsa. Other foods Spicy foods. Fried foods. Sour foods, such as pickled or fermented foods. Foods with high sugar content. Foods high in fiber. The items listed above may not be a complete list of foods and beverages to avoid. Contact a dietitian for more information. Summary  A bland diet consists of foods that are often soft and do not have a lot of fat, fiber, or extra seasonings.  Foods without fat, fiber, or seasoning are easier for the body to digest.  Check with your health care provider to see how long you should follow this diet plan. It is not meant to be followed for long periods. This information is not intended to replace advice given to you by your health care provider. Make sure you discuss any questions you have with your health care provider. Document Revised: 06/06/2017 Document Reviewed: 06/06/2017 Elsevier Patient Education  2020 [employer]  Food Choices to Help Relieve Diarrhea, Adult When you have diarrhea, the foods you eat and your eating habits are very important. Choosing the right foods and drinks can help:  Relieve diarrhea.  Replace lost fluids and nutrients.  Prevent dehydration. What general guidelines should I follow?  Relieving diarrhea  Choose foods with less than 2 g or .07 oz. of fiber per serving.  Limit fats to less than 8 tsp (38 g  or 1.34 oz.) a day.  Avoid the following: ? Foods and beverages sweetened with high-fructose corn syrup, honey, or sugar alcohols such as xylitol, sorbitol, and mannitol. ? Foods that contain a lot of fat or sugar. ? Fried, greasy, or spicy foods. ? High-fiber grains, breads, and cereals. ? Raw fruits and  vegetables.  Eat foods that are rich in probiotics. These foods include dairy products such as yogurt and fermented milk products. They help increase healthy bacteria in the stomach and intestines (gastrointestinal tract, or GI tract).  If you have lactose intolerance, avoid dairy products. These may make your diarrhea worse.  Take medicine to help stop diarrhea (antidiarrheal medicine) only as told by your health care provider. Replacing nutrients  Eat small meals or snacks every 3-4 hours.  Eat bland foods, such as white rice, toast, or baked potato, until your diarrhea starts to get better. Gradually reintroduce nutrient-rich foods as tolerated or as told by your health care provider. This includes: ? Well-cooked protein foods. ? Peeled, seeded, and soft-cooked fruits and vegetables. ? Low-fat dairy products.  Take vitamin and mineral supplements as told by your health care provider. Preventing dehydration  Start by sipping water or a special solution to prevent dehydration (oral rehydration solution, ORS). Urine that is clear or pale yellow means that you are getting enough fluid.  Try to drink at least 8-10 cups of fluid each day to help replace lost fluids.  You may add other liquids in addition to water, such as clear juice or decaffeinated sports drinks, as tolerated or as told by your health care provider.  Avoid drinks with caffeine, such as coffee, tea, or soft drinks.  Avoid alcohol. What foods are recommended?     The items listed may not be a complete list. Talk with your health care provider about what dietary choices are best for you. Grains White rice. White, [place], or pita breads (fresh or toasted), including plain rolls, buns, or bagels. White pasta. Saltine, soda, or graham crackers. Pretzels. Low-fiber cereal. Cooked cereals made with water (such as cornmeal, farina, or cream cereals). Plain muffins. Matzo. Melba toast. Zwieback. Vegetables Potatoes (without  the skin). Most well-cooked and canned vegetables without skins or seeds. Tender lettuce. Fruits Apple sauce. Fruits canned in juice. Cooked apricots, cherries, grapefruit, peaches, pears, or plums. Fresh bananas and cantaloupe. Meats and other protein foods Baked or boiled chicken. Eggs. Tofu. Fish. Seafood. Smooth nut butters. Ground or well-cooked tender beef, ham, veal, lamb, pork, or poultry. Dairy Plain yogurt, kefir, and unsweetened liquid yogurt. Lactose-free milk, buttermilk, skim milk, or soy milk. Low-fat or nonfat hard cheese. Beverages Water. Low-calorie sports drinks. Fruit juices without pulp. Strained tomato and vegetable juices. Decaffeinated teas. Sugar-free beverages not sweetened with sugar alcohols. Oral rehydration solutions, if approved by your health care provider. Seasoning and other foods Bouillon, broth, or soups made from recommended foods. What foods are not recommended? The items listed may not be a complete list. Talk with your health care provider about what dietary choices are best for you. Grains Whole grain, whole wheat, bran, or rye breads, rolls, pastas, and crackers. Wild or brown rice. Whole grain or bran cereals. Barley. Oats and oatmeal. Corn tortillas or taco shells. Granola. Popcorn. Vegetables Raw vegetables. Fried vegetables. Cabbage, broccoli, Brussels sprouts, artichokes, baked beans, beet greens, corn, kale, legumes, peas, sweet potatoes, and yams. Potato skins. Cooked spinach and cabbage. Fruits Dried fruit, including raisins and dates. Raw fruits. Stewed or dried prunes. Canned fruits with  syrup. Meat and other protein foods Fried or fatty meats. Deli meats. Chunky nut butters. Nuts and seeds. Beans and lentils. Tomasa Blase. Hot dogs. Sausage. Dairy High-fat cheeses. Whole milk, chocolate milk, and beverages made with milk, such as milk shakes. Half-and-half. Cream. sour cream. Ice cream. Beverages Caffeinated beverages (such as coffee, tea, soda,  or energy drinks). Alcoholic beverages. Fruit juices with pulp. Prune juice. Soft drinks sweetened with high-fructose corn syrup or sugar alcohols. High-calorie sports drinks. Fats and oils Butter. Cream sauces. Margarine. Salad oils. Plain salad dressings. Olives. Avocados. Mayonnaise. Sweets and desserts Sweet rolls, doughnuts, and sweet breads. Sugar-free desserts sweetened with sugar alcohols such as xylitol and sorbitol. Seasoning and other foods Honey. Hot sauce. Chili powder. Gravy. Cream-based or milk-based soups. Pancakes and waffles. Summary  When you have diarrhea, the foods you eat and your eating habits are very important.  Make sure you get at least 8-10 cups of fluid each day, or enough to keep your urine clear or pale yellow.  Eat bland foods and gradually reintroduce healthy, nutrient-rich foods as tolerated, or as told by your health care provider.  Avoid high-fiber, fried, greasy, or spicy foods. This information is not intended to replace advice given to you by your health care provider. Make sure you discuss any questions you have with your health care provider. Document Revised: 08/29/2018 Document Reviewed: 05/05/2016 Elsevier Patient Education  2020 Elsevier Inc.  Diarrhea, Adult Diarrhea is frequent loose and watery bowel movements. Diarrhea can make you feel weak and cause you to become dehydrated. Dehydration can make you tired and thirsty, cause you to have a dry mouth, and decrease how often you urinate. Diarrhea typically lasts 2-3 days. However, it can last longer if it is a sign of something more serious. It is important to treat your diarrhea as told by your health care provider. Follow these instructions at home: Eating and drinking     Follow these recommendations as told by your health care provider:  Take an oral rehydration solution (ORS). This is an over-the-counter medicine that helps return your body to its normal balance of nutrients and water.  It is found at pharmacies and retail stores.  Drink plenty of fluids, such as water, ice chips, diluted fruit juice, and low-calorie sports drinks. You can drink milk also, if desired.  Avoid drinking fluids that contain a lot of sugar or caffeine, such as energy drinks, sports drinks, and soda.  Eat bland, easy-to-digest foods in small amounts as you are able. These foods include bananas, applesauce, rice, lean meats, toast, and crackers.  Avoid alcohol.  Avoid spicy or fatty foods.  Medicines  Take over-the-counter and prescription medicines only as told by your health care provider.  If you were prescribed an antibiotic medicine, take it as told by your health care provider. Do not stop using the antibiotic even if you start to feel better. General instructions   Wash your hands often using soap and water. If soap and water are not available, use a hand sanitizer. Others in the household should wash their hands as well. Hands should be washed: ? After using the toilet or changing a diaper. ? Before preparing, cooking, or serving food. ? While caring for a sick person or while visiting someone in a hospital.  Drink enough fluid to keep your urine pale yellow.  Rest at home while you recover.  Watch your condition for any changes.  Take a warm bath to relieve any burning or pain from frequent  diarrhea episodes.  Keep all follow-up visits as told by your health care provider. This is important. Contact a health care provider if:  You have a fever.  Your diarrhea gets worse.  You have new symptoms.  You cannot keep fluids down.  You feel light-headed or dizzy.  You have a headache.  You have muscle cramps. Get help right away if:  You have chest pain.  You feel extremely weak or you faint.  You have bloody or black stools or stools that look like tar.  You have severe pain, cramping, or bloating in your abdomen.  You have trouble breathing or you are  breathing very quickly.  Your heart is beating very quickly.  Your skin feels cold and clammy.  You feel confused.  You have signs of dehydration, such as: ? Dark urine, very little urine, or no urine. ? Cracked lips. ? Dry mouth. ? Sunken eyes. ? Sleepiness. ? Weakness. Summary  Diarrhea is frequent loose and watery bowel movements. Diarrhea can make you feel weak and cause you to become dehydrated.  Drink enough fluids to keep your urine pale yellow.  Make sure that you wash your hands after using the toilet. If soap and water are not available, use hand sanitizer.  Contact a health care provider if your diarrhea gets worse or you have new symptoms.  Get help right away if you have signs of dehydration. This information is not intended to replace advice given to you by your health care provider. Make sure you discuss any questions you have with your health care provider. Document Revised: 09/24/2018 Document Reviewed: 10/12/2017 Elsevier Patient Education  2020 [employer].

## 2020-03-02 NOTE — Progress Notes (Signed)
Virtual Visit via Video Note  I connected with Kevin [name]  on 03/02/20 at  2:00 PM EDT by a video enabled telemedicine application and verified that I am speaking with the correct person using two identifiers.  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 The patient expressed understanding and agreed to proceed.   HPI: Diarrhea since 02/27/20 and worse 02/28/20 with fever 101.8, body aches, diarrhea was 12 stools per day now today 8 stools per day with fatigue, ab pain/cramps denies n/vomiting. He went on [place] with wife 02/20/20 and back 02/24/20 and mountains with friends and started to feel unwell wife doing ok. Stools watery and brown. Tried electrolyte replacement started since Sat and felt dehydrated  Took 3 home covid tests and negative  Tried peptobismol and immodium  HSV outbreak on lips, gums  ROS: See pertinent positives and negatives per HPI.  Past Medical History:  Diagnosis Date  . Allergy   . Anxiety   . Chicken pox   . Insomnia   . Shoulder pain   . Vitamin D deficiency     Past Surgical History:  Procedure Laterality Date  . ROTATOR CUFF REPAIR  2015  . VASECTOMY  2014     Current Outpatient Medications:  .  FLUoxetine (PROZAC) 20 MG tablet, Take 1 tablet (20 mg total) by mouth daily., Disp: 90 tablet, Rfl: 3 .  Multiple Vitamin (MULTI-VITAMINS) TABS, Take by mouth. , Disp: , Rfl:  .  simvastatin (ZOCOR) 40 MG tablet, Take 1 tablet (40 mg total) by mouth at bedtime., Disp: 90 tablet, Rfl: 3 .  valACYclovir (VALTREX) 1000 MG tablet, Take 2 tablets (2,000 mg total) by mouth 2 (two) times daily. X 1 day of fever blister, Disp: 90 tablet, Rfl: 3 .  traZODone (DESYREL) 50 MG tablet, TAKE 0.5-1 TABLETS (25-50 MG TOTAL) BY MOUTH AT BEDTIME AS NEEDED FOR SLEEP.  (Patient not taking: Reported on 03/02/2020), Disp: 90 tablet, Rfl: 1  EXAM:  VITALS per patient if applicable:  GENERAL: alert, oriented, appears well and in no acute distress  HEENT: atraumatic, conjunttiva clear, no obvious abnormalities on inspection of external nose and ears  NECK: normal movements of the head and neck  LUNGS: on inspection no signs of respiratory distress, breathing rate appears normal, no obvious gross SOB, gasping or wheezing  CV: no obvious cyanosis  MS: moves all visible extremities without noticeable abnormality  PSYCH/NEURO: pleasant and cooperative, no obvious depression or anxiety, speech and thought processing grossly intact  ASSESSMENT AND PLAN:  Discussed the following assessment and plan:  Diarrhea, unspecified type - Plan: Clostridium Difficile by PCR([facility]), GI pathogen panel by PCR, stool Probiotics align  Supportive care  Hydration  Cont peptobismol hold immodium  HSV infection Rx valtrex   -we discussed possible serious and likely etiologies, options for evaluation and workup, limitations of telemedicine visit vs in person visit, treatment, treatment risks and precautions.   I discussed the assessment and treatment plan with the patient. The patient was provided an opportunity to ask questions and all were answered. The patient agreed with the plan and demonstrated an understanding of the instructions.    Time spent 20 min Bevelyn Buckles, MD

## 2020-03-02 NOTE — Progress Notes (Signed)
Onset of symptoms this past Friday and worsened on Saturday. Most symptoms have gone away, but still having diarrhea and stomach cramps.  Negative home Covid-19 test and was negative. (3 times). Recently went on mountain trip with a few friends. No one sick.   Patient also recently trawled to [place].

## 2020-03-06 LAB — GI PATHOGEN PANEL BY PCR, STOOL
Adenovirus F 40/41: NOT DETECTED
Astrovirus: NOT DETECTED
Campylobacter by PCR: NOT DETECTED
Cryptosporidium by PCR: NOT DETECTED
Cyclospora cayetanensis: NOT DETECTED
E coli (ETEC) LT/ST: NOT DETECTED
E coli (STEC): NOT DETECTED
Entamoeba histolytica: NOT DETECTED
Enteroaggregative E coli: NOT DETECTED
Enteropathogenic E coli: NOT DETECTED
G lamblia by PCR: NOT DETECTED
Norovirus GI/GII: NOT DETECTED
Plesiomonas shigelloides: NOT DETECTED
Rotavirus A by PCR: NOT DETECTED
Salmonella by PCR: DETECTED — AB
Sapovirus: NOT DETECTED
Shigella by PCR: NOT DETECTED
Vibrio cholerae: NOT DETECTED
Vibrio: NOT DETECTED
Yersinia enterocolitica: NOT DETECTED

## 2020-03-08 ENCOUNTER — Other Ambulatory Visit: Payer: Self-pay | Admitting: Internal Medicine

## 2020-03-08 DIAGNOSIS — E782 Mixed hyperlipidemia: Secondary | ICD-10-CM

## 2020-03-08 MED ORDER — SIMVASTATIN 40 MG PO TABS: 40.0000 mg | ORAL_TABLET | Freq: Every day | ORAL | 3 refills | Status: DC

## 2020-05-03 LAB — HM COLONOSCOPY

## 2020-06-02 ENCOUNTER — Encounter: Payer: Self-pay | Admitting: Internal Medicine

## 2020-06-02 NOTE — Progress Notes (Unsigned)
I, Kevin [name], LAT, ATC, am serving as scribe for Dr. Clementeen [name].  Kevin [name] is a 45 y.o. male who presents to [facility] today for L shoulder and L elbow pain.  He was last seen by Dr. [name] on 10/15/19 for L-sided LBP.  Since then, pt reports new L shoulder pain ongoing for approximately 6 months that has been gradually getting worse. Pt also c/oL elbow pain ongoing for 3 weeks. No known MOI for either pathology. He locates his pain specifically to L lateral epicondyl and c/o increased pain w/ gripping. Pt locates shoulder pain to the anterior and poster apsect of L shoulder.    Radiating pain: no Numbness/tingling: no L shoulder mechanical symptoms: yes- clicking Aggravating factors: shoulder flexion, ext, ABD, gripping Treatments tried: shoulder RC exercises, ball massage  Dx imaging: 09/20/17 L shoulder XR  Pertinent review of systems: No fevers or chills  Relevant historical information: Hyperlipidemia.  History of Mnire's disease.   Exam:  BP 118/78 (BP Location: Right Arm, Patient Position: Sitting, Cuff Size: Normal)   Pulse 63   Ht 5\' 10"  (1.778 m)   Wt 217 lb 3.2 oz (98.5 kg)   SpO2 98%   BMI 31.16 kg/m  General: Well Developed, well nourished, and in no acute distress.   MSK: Left shoulder normal-appearing Normal motion some pain with abduction. Tender palpation AC joint. Intact strength internal rotation.  Strength 4/5 to abduction and external rotation with pain. Positive empty can test. Positive Hawkins and Neer's test. Negative Yergason's and speeds test.  Left elbow normal.  Normal motion normal strength.  Tender palpation lateral epicondyle.  Pain with resisted wrist extension.    Lab and Radiology Results  Diagnostic Limited MSK Ultrasound of: Left shoulder and left elbow Left shoulder: Biceps tendon is intact.  Small amount of hypoechoic fluid tracks within biceps tendon sheath. Subscapularis tendon is  intact. Supraspinatus tendon is intact however significant subacromial bursitis pattern is present. Infraspinatus tendon is intact. AC joint with effusion and mild degeneration. Left lateral elbow: Normal radial head and lateral joint structure. Lateral epicondyle common extensor tendon insertion is intact however small amount of hyperechoic linear streaking at superficial tip consistent with avulsion.  Impression: Left shoulder: Subacromial bursitis.  AC DJD. Left elbow: Lateral epicondylitis     Assessment and Plan: 45 y.o. male with left shoulder pain ongoing for 6 months.  Physical exam and ultrasound examination are consistent with subacromial bursitis, rotator cuff tendinitis, and mild AC DJD.  Plan for physical therapy.  Home exercise program taught in clinic today.  Voltaren gel also reasonable to use.  Recheck in 6 weeks.  Left elbow pain: Due to lateral epicondylitis.  Home exercise program reviewed in clinic today.  Also will receive some physical therapy. Again recheck in 6 weeks.   PDMP not reviewed this encounter. Orders Placed This Encounter  Procedures  . 49 LIMITED JOINT SPACE STRUCTURES UP LEFT(NO LINKED CHARGES)    Standing Status:   Future    Number of Occurrences:   1    Standing Expiration Date:   12/01/2020    Order Specific Question:   Reason for Exam (SYMPTOM  OR DIAGNOSIS REQUIRED)    Answer:   Left shoulder pain    Order Specific Question:   Preferred imaging location?    Answer:   12/03/2020 [facility]  . Ambulatory referral to Physical Therapy    Referral Priority:   Routine    Referral  Type:   Physical Medicine    Referral Reason:   Specialty Services Required    Requested Specialty:   Physical Therapy    Number of Visits Requested:   1   No orders of the defined types were placed in this encounter.    Discussed warning signs or symptoms. Please see discharge instructions. Patient expresses understanding.   The above  documentation has been reviewed and is accurate and complete Kevin [name], M.D.

## 2020-06-03 ENCOUNTER — Other Ambulatory Visit: Payer: Self-pay

## 2020-06-03 ENCOUNTER — Ambulatory Visit: Payer: 59 | Admitting: Family Medicine

## 2020-06-03 ENCOUNTER — Ambulatory Visit: Payer: Self-pay

## 2020-06-03 VITALS — BP 118/78 | HR 63 | Ht 70.0 in | Wt 217.2 lb

## 2020-06-03 DIAGNOSIS — G8929 Other chronic pain: Secondary | ICD-10-CM

## 2020-06-03 DIAGNOSIS — M25512 Pain in left shoulder: Secondary | ICD-10-CM

## 2020-06-03 DIAGNOSIS — M7582 Other shoulder lesions, left shoulder: Secondary | ICD-10-CM

## 2020-06-03 DIAGNOSIS — M25522 Pain in left elbow: Secondary | ICD-10-CM | POA: Diagnosis not present

## 2020-06-03 DIAGNOSIS — M7712 Lateral epicondylitis, left elbow: Secondary | ICD-10-CM | POA: Diagnosis not present

## 2020-06-03 NOTE — Patient Instructions (Addendum)
Thank you for coming in today.  I've referred you to Physical Therapy.  Let us know if you don't hear from them in one week. Please complete the exercises that the athletic trainer went over with you: View at [email or web address] using code: [id number]  Please use voltaren gel up to 4x daily for pain as needed.   Ok to use the tennis elbow strap.   Recheck in 6 weeks.   If not better we can do injections etc or even MRI.    Tennis Elbow Rehab Ask your health care provider which exercises are safe for you. Do exercises exactly as told by your health care provider and adjust them as directed. It is normal to feel mild stretching, pulling, tightness, or discomfort as you do these exercises. Stop right away if you feel sudden pain or your pain gets worse. Do not begin these exercises until told by your health care provider. Stretching and range-of-motion exercises These exercises warm up your muscles and joints and improve the movement and flexibility of your elbow. Wrist flexion, assisted 1. Straighten your left / right elbow in front of you with your palm facing down toward the floor. ? If told by your health care provider, bend your left / right elbow to a 90-degree angle (right angle) at your side instead of holding it straight. 2. With your other hand, gently push over the back of your left / right hand so your fingers point toward the floor (flexion). Stop when you feel a gentle stretch on the back of your forearm. 3. Hold this position for __________ seconds. Repeat __________ times. Complete this exercise __________ times a day.   Wrist extension, assisted 1. Straighten your left / right elbow in front of you with your palm facing up toward the ceiling. ? If told by your health care provider, bend your left / right elbow to a 90-degree angle (right angle) at your side instead of holding it straight. 2. With your other hand, gently pull your left / right hand and fingers toward the floor  (extension). Stop when you feel a gentle stretch on the palm side of your forearm. 3. Hold this position for __________ seconds. Repeat __________ times. Complete this exercise __________ times a day.   Assisted forearm rotation, supination 1. Sit or stand with your elbows at your side. 2. Bend your left / right elbow to a 90-degree angle (right angle). 3. Using your uninjured hand, turn your left / right palm up toward the ceiling (supination) until you feel a gentle stretch along the inside of your forearm. 4. Hold this position for __________ seconds. Repeat __________ times. Complete this exercise __________ times a day. Assisted forearm rotation, pronation 1. Sit or stand with your elbows at your side. 2. Bend your left / right elbow to a 90-degree angle (right angle). 3. Using your uninjured hand, turn your left / right palm down toward the floor (pronation) until you feel a gentle stretch along the outside of your forearm. 4. Hold this position for __________ seconds. Repeat __________ times. Complete this exercise __________ times a day. Strengthening exercises These exercises build strength and endurance in your forearm and elbow. Endurance is the ability to use your muscles for a long time, even after they get tired. Radial deviation 1. Stand with a __________ weight or a hammer in your left / right hand. Or, sit while holding a rubber exercise band or tubing, with your left / right forearm supported on a table or countertop. ?  Position your forearm so that the thumb is facing the ceiling, as if you are going to clap your hands. This is the neutral position. 2. Raise your hand upward in front of you so your thumb moves toward the ceiling (radial deviation), or pull up on the rubber tubing. Keep your forearm and elbow still while you move your wrist only. 3. Hold this position for __________ seconds. 4. Slowly return to the starting position. Repeat __________ times. Complete this  exercise __________ times a day.   Wrist extension, eccentric 1. Sit with your left / right forearm palm-down and supported on a table or other surface. Let your left / right wrist extend over the edge of the surface. 2. Hold a __________ weight or a piece of exercise band or tubing in your left / right hand. ? If using a rubber exercise band or tubing, hold the other end of the tubing with your other hand. 3. Use your uninjured hand to move your left / right hand up toward the ceiling. 4. Take your uninjured hand away and slowly return to the starting position using only your left / right hand. Lowering your arm under tension is called eccentric extension. Repeat __________ times. Complete this exercise __________ times a day. Wrist extension Do not do this exercise if it causes pain at the outside of your elbow. Only do this exercise once instructed by your health care provider. 1. Sit with your left / right forearm supported on a table or other surface and your palm turned down toward the floor. Let your left / right wrist extend over the edge of the surface. 2. Hold a __________ weight or a piece of rubber exercise band or tubing. ? If you are using a rubber exercise band or tubing, hold the band or tubing in place with your other hand to provide resistance. 3. Slowly bend your wrist so your hand moves up toward the ceiling (extension). Move only your wrist, keeping your forearm and elbow still. 4. Hold this position for __________ seconds. 5. Slowly return to the starting position. Repeat __________ times. Complete this exercise __________ times a day.   Forearm rotation, supination To do this exercise, you will need a lightweight hammer or rubber mallet. 1. Sit with your left / right forearm supported on a table or other surface. Bend your elbow to a 90-degree angle (right angle). Position your forearm so that your palm is facing down toward the floor, with your hand resting over the edge of  the table. 2. Hold a hammer in your left / right hand. ? To make this exercise easier, hold the hammer near the head of the hammer. ? To make this exercise harder, hold the hammer near the end of the handle. 3. Without moving your wrist or elbow, slowly rotate your forearm so your palm faces up toward the ceiling (supination). 4. Hold this position for __________ seconds. 5. Slowly return to the starting position. Repeat __________ times. Complete this exercise __________ times a day.   Shoulder blade squeeze 1. Sit in a stable chair or stand with good posture. If you are sitting down, do not let your back touch the back of the chair. 2. Your arms should be at your sides with your elbows bent to a 90-degree angle (right angle). Position your forearms so that your thumbs are facing the ceiling (neutral position). 3. Without lifting your shoulders up, squeeze your shoulder blades tightly together. 4. Hold this position for __________ seconds. 5.  Slowly release and return to the starting position. Repeat __________ times. Complete this exercise __________ times a day. This information is not intended to replace advice given to you by your health care provider. Make sure you discuss any questions you have with your health care provider. Document Revised: 07/30/2019 Document Reviewed: 07/30/2019 Elsevier Patient Education  2021 [employer].

## 2020-08-31 ENCOUNTER — Ambulatory Visit: Payer: 59 | Admitting: Internal Medicine

## 2020-09-14 ENCOUNTER — Ambulatory Visit: Payer: 59 | Admitting: Internal Medicine

## 2020-10-15 ENCOUNTER — Ambulatory Visit (INDEPENDENT_AMBULATORY_CARE_PROVIDER_SITE_OTHER): Payer: 59 | Admitting: Internal Medicine

## 2020-10-15 ENCOUNTER — Encounter: Payer: Self-pay | Admitting: Internal Medicine

## 2020-10-15 ENCOUNTER — Other Ambulatory Visit: Payer: Self-pay

## 2020-10-15 VITALS — BP 122/86 | HR 52 | Temp 97.7°F | Ht 70.47 in | Wt 209.6 lb

## 2020-10-15 DIAGNOSIS — Z1329 Encounter for screening for other suspected endocrine disorder: Secondary | ICD-10-CM

## 2020-10-15 DIAGNOSIS — F419 Anxiety disorder, unspecified: Secondary | ICD-10-CM

## 2020-10-15 DIAGNOSIS — F32A Depression, unspecified: Secondary | ICD-10-CM

## 2020-10-15 DIAGNOSIS — Z0000 Encounter for general adult medical examination without abnormal findings: Secondary | ICD-10-CM

## 2020-10-15 DIAGNOSIS — E785 Hyperlipidemia, unspecified: Secondary | ICD-10-CM

## 2020-10-15 DIAGNOSIS — B009 Herpesviral infection, unspecified: Secondary | ICD-10-CM | POA: Diagnosis not present

## 2020-10-15 DIAGNOSIS — E559 Vitamin D deficiency, unspecified: Secondary | ICD-10-CM

## 2020-10-15 DIAGNOSIS — Z125 Encounter for screening for malignant neoplasm of prostate: Secondary | ICD-10-CM

## 2020-10-15 DIAGNOSIS — Z1389 Encounter for screening for other disorder: Secondary | ICD-10-CM

## 2020-10-15 LAB — LIPID PANEL
Cholesterol: 133 mg/dL (ref 0–200)
HDL: 37.9 mg/dL — ABNORMAL LOW (ref >39.00)
LDL Cholesterol: 68 mg/dL (ref 0–99)
NonHDL: 94.69
Total CHOL/HDL Ratio: 3
Triglycerides: 132 mg/dL (ref 0.0–149.0)
VLDL: 26.4 mg/dL (ref 0.0–40.0)

## 2020-10-15 LAB — COMPREHENSIVE METABOLIC PANEL
ALT: 25 U/L (ref 0–53)
AST: 23 U/L (ref 0–37)
Albumin: 4.6 g/dL (ref 3.5–5.2)
Alkaline Phosphatase: 74 U/L (ref 39–117)
BUN: 19 mg/dL (ref 6–23)
CO2: 30 mEq/L (ref 19–32)
Calcium: 9.2 mg/dL (ref 8.4–10.5)
Chloride: 101 mEq/L (ref 96–112)
Creatinine, Ser: 1.11 mg/dL (ref 0.40–1.50)
GFR: 79.38 mL/min (ref >60.00)
Glucose, Bld: 90 mg/dL (ref 70–99)
Potassium: 4.3 mEq/L (ref 3.5–5.1)
Sodium: 138 mEq/L (ref 135–145)
Total Bilirubin: 0.7 mg/dL (ref 0.2–1.2)
Total Protein: 6.9 g/dL (ref 6.0–8.3)

## 2020-10-15 LAB — CBC WITH DIFFERENTIAL/PLATELET
Basophils Absolute: 0 10*3/uL (ref 0.0–0.1)
Basophils Relative: 0.5 % (ref 0.0–3.0)
Eosinophils Absolute: 0.1 10*3/uL (ref 0.0–0.7)
Eosinophils Relative: 1.7 % (ref 0.0–5.0)
HCT: 44.8 % (ref 39.0–52.0)
Hemoglobin: 15.5 g/dL (ref 13.0–17.0)
Lymphocytes Relative: 40.7 % (ref 12.0–46.0)
Lymphs Abs: 2.2 10*3/uL (ref 0.7–4.0)
MCHC: 34.5 g/dL (ref 30.0–36.0)
MCV: 96.2 fl (ref 78.0–100.0)
Monocytes Absolute: 0.6 10*3/uL (ref 0.1–1.0)
Monocytes Relative: 11.4 % (ref 3.0–12.0)
Neutro Abs: 2.4 10*3/uL (ref 1.4–7.7)
Neutrophils Relative %: 45.7 % (ref 43.0–77.0)
Platelets: 208 10*3/uL (ref 150.0–400.0)
RBC: 4.66 Mil/uL (ref 4.22–5.81)
RDW: 12.5 % (ref 11.5–15.5)
WBC: 5.4 10*3/uL (ref 4.0–10.5)

## 2020-10-15 LAB — TSH: TSH: 0.95 u[IU]/mL (ref 0.35–4.50)

## 2020-10-15 LAB — PSA: PSA: 0.88 ng/mL (ref 0.10–4.00)

## 2020-10-15 LAB — VITAMIN D 25 HYDROXY (VIT D DEFICIENCY, FRACTURES): VITD: 24.37 ng/mL — ABNORMAL LOW (ref 30.00–100.00)

## 2020-10-15 MED ORDER — FLUOXETINE HCL 20 MG PO TABS: 20.0000 mg | ORAL_TABLET | Freq: Every day | ORAL | 3 refills | Status: DC

## 2020-10-15 MED ORDER — VALACYCLOVIR HCL 1 G PO TABS: 2000.0000 mg | ORAL_TABLET | Freq: Two times a day (BID) | ORAL | 3 refills | Status: DC

## 2020-10-15 NOTE — Patient Instructions (Addendum)
Dr. Rennis Golden cardiologist in [place] if cholesterol not controlled

## 2020-10-15 NOTE — Progress Notes (Signed)
Chief Complaint  Patient presents with  . Annual Exam   Annual  1. hld on zocor 40 mg qhs consider Dr. Debara Pickett cards for lipid in the future  2. Insomnia better not taking trazadone  3. Left shoulder pain better after PT  4. Elevated BP  Review of Systems  Constitutional: Negative for weight loss.  HENT: Negative for hearing loss.   Eyes: Negative for blurred vision.  Respiratory: Negative for shortness of breath.   Cardiovascular: Negative for chest pain.  Gastrointestinal: Negative for abdominal pain.  Musculoskeletal: Negative for joint pain.  Skin: Negative for rash.  Neurological: Negative for headaches.  Psychiatric/Behavioral: Negative for depression.   Past Medical History:  Diagnosis Date  . Allergy   . Anxiety   . Chicken pox   . Insomnia   . Shoulder pain   . Vitamin D deficiency    Past Surgical History:  Procedure Laterality Date  . ROTATOR CUFF REPAIR  2015  . VASECTOMY  2014   Family History  Problem Relation Age of Onset  . Hypertension Mother   . Hyperlipidemia Father   . Hyperlipidemia Maternal Grandmother   . Hypertension Maternal Grandmother   . Arthritis Maternal Grandmother    Social History   Socioeconomic History  . Marital status: Married    Spouse name: Not on file  . Number of children: Not on file  . Years of education: Not on file  . Highest education level: Not on file  Occupational History  . Not on file  Tobacco Use  . Smoking status: Never Smoker  . Smokeless tobacco: Never Used  Substance and Sexual Activity  . Alcohol use: Yes    Alcohol/week: 10.0 standard drinks    Types: 10 Cans of beer per week  . Drug use: Yes    Comment: acid- teenager  . Sexual activity: Yes    Birth control/protection: None  Other Topics Concern  . 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 Multiple Vitamin (MULTI-VITAMINS) TABS Take by mouth.   . simvastatin (ZOCOR) 40 MG tablet Take 1 tablet (40 mg total) by mouth at bedtime.  . [DISCONTINUED] FLUoxetine (PROZAC) 20 MG tablet Take 1 tablet (20 mg total) by mouth daily.  . [DISCONTINUED] traZODone (DESYREL) 50 MG tablet TAKE 0.5-1 TABLETS (25-50 MG TOTAL) BY MOUTH AT BEDTIME AS NEEDED FOR SLEEP.  . [DISCONTINUED] valACYclovir (VALTREX) 1000 MG tablet Take 2 tablets (2,000 mg total) by mouth 2 (two) times daily. X 1 day of fever blister   No Known Allergies No results found for this or any previous visit (from the past 2160 hour(s)). Objective  Body mass index is 29.67 kg/m. Wt Readings from Last 3 Encounters:  10/15/20 209 lb 9.6 oz (95.1 kg)  06/03/20 217 lb 3.2 oz (98.5 kg)  03/02/20 210 lb (95.3 kg)   Temp Readings from Last 3 Encounters:  10/15/20 97.7 F (36.5 C) (Oral)  11/04/19 (!) 97.4 F (36.3 C) (Temporal)  05/29/18 97.7 F (36.5 C) (Oral)   BP Readings from Last 3 Encounters:  10/15/20 122/86  06/03/20 118/78  11/04/19 124/82   Pulse Readings from Last 3 Encounters:  10/15/20 (!) 52  06/03/20 63  11/04/19 65    Physical Exam Vitals and nursing note reviewed.  Constitutional:      Appearance: Normal appearance. He is well-developed, well-groomed and overweight.  HENT:     Head: Normocephalic and atraumatic.     Mouth/Throat:     Mouth: Mucous membranes are moist.     Pharynx: Oropharynx is clear.  Cardiovascular:     Rate and Rhythm: Normal rate and regular rhythm.     Heart sounds: Normal heart sounds. No murmur heard.   Pulmonary:     Effort: Pulmonary effort is normal.     Breath sounds: Normal breath sounds.  Abdominal:     General: Abdomen is flat. Bowel sounds  are normal.  Skin:    General: Skin is warm and dry.  Neurological:     General: No focal deficit present.     Mental Status: He is alert and oriented to person, place, and time. Mental status is at baseline.     Gait: Gait normal.  Psychiatric:        Attention and Perception: Attention and perception normal.        Mood and Affect: Mood and affect normal.        Speech: Speech normal.        Behavior: Behavior normal. Behavior is cooperative.        Thought Content: Thought content normal.        Cognition and Memory: Cognition and memory normal.        Judgment: Judgment normal.     Assessment  Plan  Annual physical exam - Plan: Comprehensive metabolic panel, Lipid panel, CBC with Differential/Platelet, TSH, Urinalysis, Routine w reflex microscopic, PSA, Vitamin D (25 hydroxy) utd flu UTD Tdap MMR immune 3/3 pfizer consider 4th dose Hep B 2/2 need protected hep B  Cont exercise to 2x per week healthy diet choices  No need for dermatology now benign nevi Colonoscopy referred [facility] Dr. Alice Reichert had 05/03/20  09/23/19 normal PSA referred urology  rec healthy diet and exercise   hld Consider referral Dr. Debara Pickett   HSV-1 infection - Plan: valACYclovir (VALTREX) 1000 MG tablet  Depression, unspecified depression type - Plan: FLUoxetine (PROZAC) 20 MG tablet Anxiety - Plan: FLUoxetine (PROZAC) 20 MG tablet   Provider: Dr. Olivia Mackie McLean-Scocuzza-[facility]

## 2020-10-16 LAB — URINALYSIS, ROUTINE W REFLEX MICROSCOPIC
Bilirubin Urine: NEGATIVE
Glucose, UA: NEGATIVE
Hgb urine dipstick: NEGATIVE
Ketones, ur: NEGATIVE
Leukocytes,Ua: NEGATIVE
Nitrite: NEGATIVE
Protein, ur: NEGATIVE
Specific Gravity, Urine: 1.005 (ref 1.001–1.035)
pH: 7 (ref 5.0–8.0)

## 2020-10-22 NOTE — Addendum Note (Signed)
Addended by: Quentin Ore on: 10/22/2020 05:02 PM   Modules accepted: Orders

## 2020-10-25 ENCOUNTER — Telehealth: Payer: Self-pay | Admitting: Internal Medicine

## 2020-10-25 NOTE — Telephone Encounter (Signed)
lft pt vm to call ofc regarding CT. thanks

## 2020-10-25 NOTE — Telephone Encounter (Signed)
PT called to return the call to setup a appt.

## 2020-10-26 ENCOUNTER — Telehealth: Payer: Self-pay | Admitting: Internal Medicine

## 2020-10-26 NOTE — Telephone Encounter (Signed)
lft vm for pt with appt time date and location. thanks

## 2020-10-26 NOTE — Addendum Note (Signed)
Addended by: Quentin Ore on: 10/26/2020 02:45 PM   Modules accepted: Orders

## 2020-11-21 ENCOUNTER — Emergency Department
Admission: EM | Admit: 2020-11-21 | Discharge: 2020-11-21 | Disposition: A | Discharge status: Home / Self Care | Payer: 59 | Attending: Student | Admitting: Student

## 2020-11-21 ENCOUNTER — Other Ambulatory Visit: Payer: Self-pay

## 2020-11-21 ENCOUNTER — Emergency Department: Payer: 59

## 2020-11-21 DIAGNOSIS — S0181XA Laceration without foreign body of other part of head, initial encounter: Secondary | ICD-10-CM | POA: Diagnosis not present

## 2020-11-21 DIAGNOSIS — S0990XA Unspecified injury of head, initial encounter: Secondary | ICD-10-CM | POA: Diagnosis present

## 2020-11-21 DIAGNOSIS — Z5321 Procedure and treatment not carried out due to patient leaving prior to being seen by health care provider: Secondary | ICD-10-CM | POA: Diagnosis not present

## 2020-11-21 DIAGNOSIS — Y9234 Swimming pool (public) as the place of occurrence of the external cause: Secondary | ICD-10-CM | POA: Diagnosis not present

## 2020-11-21 DIAGNOSIS — W228XXA Striking against or struck by other objects, initial encounter: Secondary | ICD-10-CM | POA: Insufficient documentation
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
## 2020-11-21 NOTE — ED Notes (Signed)
No answer in lobby for vital sign recheck
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
## 2020-11-21 NOTE — ED Notes (Signed)
Patient transported to CT 

## 2020-11-21 NOTE — ED Notes (Signed)
Pt left. 

## 2020-11-25 ENCOUNTER — Ambulatory Visit
Admission: RE | Admit: 2020-11-25 | Discharge: 2020-11-25 | Disposition: A | Discharge status: Home / Self Care | Payer: 59 | Source: Ambulatory Visit | Attending: Internal Medicine | Admitting: Internal Medicine

## 2020-11-25 ENCOUNTER — Other Ambulatory Visit: Payer: Self-pay

## 2020-11-25 DIAGNOSIS — E785 Hyperlipidemia, unspecified: Secondary | ICD-10-CM | POA: Insufficient documentation

## 2020-11-26 ENCOUNTER — Encounter: Payer: Self-pay | Admitting: Internal Medicine

## 2020-11-26 DIAGNOSIS — R911 Solitary pulmonary nodule: Secondary | ICD-10-CM | POA: Insufficient documentation

## 2020-12-02 ENCOUNTER — Telehealth (INDEPENDENT_AMBULATORY_CARE_PROVIDER_SITE_OTHER): Payer: 59 | Admitting: Internal Medicine

## 2020-12-02 ENCOUNTER — Encounter: Payer: Self-pay | Admitting: Internal Medicine

## 2020-12-02 ENCOUNTER — Other Ambulatory Visit: Payer: Self-pay

## 2020-12-02 VITALS — Ht 70.0 in | Wt 205.0 lb

## 2020-12-02 DIAGNOSIS — F0781 Postconcussional syndrome: Secondary | ICD-10-CM | POA: Diagnosis not present

## 2020-12-02 DIAGNOSIS — S060X9D Concussion with loss of consciousness of unspecified duration, subsequent encounter: Secondary | ICD-10-CM

## 2020-12-02 DIAGNOSIS — R911 Solitary pulmonary nodule: Secondary | ICD-10-CM

## 2020-12-02 DIAGNOSIS — S060X9A Concussion with loss of consciousness of unspecified duration, initial encounter: Secondary | ICD-10-CM | POA: Insufficient documentation

## 2020-12-02 NOTE — Patient Instructions (Signed)
Returning to Sports and Activities After a Concussion, Adult Knowing when to return to sports and activities after a concussion is important. Make sure you wait to return to activity until after both of these have occurred: Your symptoms are completely gone. A health care provider says it is safe. Returning to activity too soon increases the risk of serious and long-lasting symptoms. You may also need to take time away from work or other activities that require concentration, depending on how severe your concussion is. Concussions can have serious effects on your brain. People who have more than one concussion are at greater risk of having long-term (chronic) headaches and problems with learning. When can I return to sports or other activities? You should stop participating in an activity right away after you hit your head or a concussion is suspected. You need to rest physically and mentally. You should also be monitored carefully by another adult. How quickly you can return to sports and other activities depends on: Your age. The severity of your concussion. Your health before the injury. Whether you have had a previous concussion. How should I gradually return to sports or other activities?  You should not return to sports or activities until you are symptom-free without medicine for at least 24 hours. Your health care provider will determine when your symptoms are completely gone and when it is safe for you topractice and play sports again. Make sure you return to sports gradually. Do not try to do too much too soon. Gradually advance through the following activity levels to return to sports: Begin with only light aerobic activity to increase your heart rate. You may bike, walk, or jog for up to 10 minutes. Do not jump, run, or lift weights. Get moderate physical activity with some head and body movements. Running short distances, fast jogging, using a stationary bike, and moderate-intensity  weight lifting are okay. Participate in high-intensity exercise without physical contact. Return to your normal practice routine, which may include full contact. Return to play in games, matches, or other competitions. Some people can progress quickly through these levels. Other people will need several days to go from one level to the next. Do not move on to the next level until you have been symptom-free for at least 24 hours after doing activity at the previous level. Symptoms to watch for include: Tiredness (fatigue). Headache. Problems with balance, coordination, or memory. If you notice any of these warning signs during exercise or other physical activities, rest for at least 24 hours or until the symptoms go away. You can then return to activity. Start at the activity level that you were on beforeyour symptoms began. What symptoms are important to report to my health care provider? Concussion symptoms may not appear right away. They could also get worse at any time. It is important to let your health care provider know if you have any newor worsening symptoms. Watch for these symptoms: Physical symptoms Headaches. Dizziness and problems with coordination or balance. Sensitivity to light or noise. Nausea or vomiting. Tiredness (fatigue). Vision or hearing problems. Seizure. Mental and emotional symptoms Irritability or mood changes. Memory problems. Trouble concentrating, organizing, or making decisions. Changes in eating or sleeping patterns. Slowness in thinking, acting or reacting, speaking, or reading. Anxiety or depression. Certain health issues may make your recovery from a concussion take longer. Let your health care provider know if you have a history of: Migraines. Depression. Mood disorders. Anxiety. A developmental disorder, such as ADHD. Any previous  concussions. What are some questions to ask my health care provider? When you have a concussion, learning as much as  you can about your injury can help you protect your long-term health. Ask your health care provider thefollowing questions: Questions about recovery and treatment Is it safe for me to return to sports or other physical activities? Should I limit how much time I watch TV, play video games, or use a computer? Do I need to take time away from work? Do I need more sleep than normal? Questions about the effects of a concussion What are the short-term and long-term consequences of my injury? How might the concussion affect my professional life? Could I have problems with memory or learning? Questions about getting another concussion When should I go to the emergency room? What happens if I get another concussion? What are the warning signs of another concussion? Could I have a concussion without knowing it? How can I prevent another concussion from happening? Should I consider not playing sports anymore? Where to find more information Centers for Disease Control and Prevention: [employer]Ex[employer].com.brwww.cdc.gov Summary Returning to activity too soon after a concussion increases the risk of serious and long-lasting symptoms. After a concussion, you must rest physically and mentally until your symptoms are gone. Your health care provider will determine when it is safe for you to return to sports, work, and other activities that require concentration. Returning to activity is a slow process, starting with limited activity and monitoring for symptoms. If symptoms do not occur with activity, the intensity of activity can be increased. Make sure you talk with your health care provider before returning to activity. This information is not intended to replace advice given to you by your health care provider. Make sure you discuss any questions you have with your healthcare provider. Document Revised: 12/18/2018 Document Reviewed: 12/18/2018 Elsevier Patient Education  2022 [employer]  Post-Concussion Syndrome  A  concussion is a brain injury from a direct hit to the head or body. This hit causes the brain to shake quickly back and forth inside the skull. This can damage brain cells and cause chemical changes in the brain. Concussions areusually not life-threatening but can cause serious symptoms. Post-concussion syndrome is when symptoms that occur after a concussion lastlonger than normal. These symptoms can last from weeks to months. What are the causes? The cause of this condition is not known. It can happen whether your headinjury was mild or severe. What increases the risk? You are more likely to develop this condition if: You are male. You are a child, teen, or young adult. You have had a past head injury. You have a history of headaches. You have depression or anxiety. You have loss of consciousness or cannot remember the event (have amnesia of the event). You have multiple symptoms or severe symptoms at the time of your concussion. What are the signs or symptoms? Symptoms of this condition include: Physical symptoms. You may have: Headaches. Tiredness. Dizziness and weakness. Blurry vision and sensitivity to light. Hearing difficulties. Problems with balance. Mental and emotional symptoms. You may have: Memory problems and trouble concentrating. Difficulty sleeping or staying asleep. Feelings of irritability. Anxiety or depression. Difficulty learning new things. How is this diagnosed? This condition may be diagnosed based on: Your symptoms. A description of your injury. Your medical history. Testing your strength, balance, and nerve function (neurological examination). Your health care provider may order other tests, including brain imaging such as a CT scan or an MRI, and  memory testing (neuropsychological testing). How is this treated? Treatment for this condition may depend on your symptoms. Symptoms usually go away on their own over time. Treatments may include: Medicines  for headaches, anxiety, depression, and trouble sleeping (insomnia). Resting your brain and body for a few days after your injury. Rehabilitation therapy, such as: Physical or occupational therapy. This may include exercises to help with balance and dizziness. Mental health counseling. A form of talk therapy called cognitive behavioral therapy (CBT) can be especially helpful. This therapy helps you set goals and follow up on the changes that you make. Speech therapy. Vision therapy. A brain and eye specialist can recommend treatments for vision problems. Follow these instructions at home: Medicines Take over-the-counter and prescription medicines only as told by your health care provider. Avoid opioid prescription pain medicines when recovering from a concussion. Activity Limit your mental activities for the first few days after your injury. This may include not doing the following: Homework or job-related work. Complex thinking. Watching TV, and using a computer or phone. Playing memory games and puzzles. Gradually return to your normal activity level. If a certain activity brings on your symptoms, stop or slow down until you can do the activity without it triggering your symptoms. Limit physical activity, such as exercise or sports, for the first few days after a concussion. Gradually return to normal activity as told by your health care provider. Rest. Rest helps your brain heal. Make sure you: Get plenty of sleep at night. Most adults should get at least 7-9 hours of sleep each night. Rest during the day. Take naps or rest breaks when you feel tired. Do not do high-risk activities that could cause a second concussion, such as riding a bike or playing sports. Having another concussion before the first one has healed can be dangerous. General instructions  Do not drink alcohol until your health care provider says that you can. Keep track of the frequency and the severity of your symptoms.  Give this information to your health care provider. Keep all follow-up visits as directed by your health care provider. This is important. This includes visits with specialists.  Contact a health care provider if: Your symptoms do not improve. You have another injury. Get help right away if you: Have a severe or worsening headache. Are confused. Have trouble staying awake. Faint. Vomit. Have weakness or numbness in any part of your body. Have a seizure. Have trouble speaking. Summary Post-concussion syndrome is when symptoms that occur after a concussion last longer than normal. Symptoms usually go away on their own over time. Depending on your symptoms, you may need treatment, such as medicines or rehabilitation therapy. Rest your brain and body for a few days after your injury. Gradually return to normal activities as told by your health care provider. Get plenty of sleep, and avoid alcohol and opioid pain medicines while recovering from a concussion. This information is not intended to replace advice given to you by your health care provider. Make sure you discuss any questions you have with your healthcare provider. Document Revised: 04/30/2019 Document Reviewed: 04/30/2019 Elsevier Patient Education  2022 [employer]  Concussion, Adult  A concussion is a brain injury from a hard, direct hit (trauma) to the head or body. This direct hit causes the brain to shake quickly back and forth inside the skull. This can damage brain cells and cause chemical changes in the brain. A concussion may also be known as a mild traumatic braininjury (TBI). Concussions  are usually not life-threatening, but the effects of a concussion can be serious. If you have a concussion, you should be very careful to avoidhaving a second concussion. What are the causes? This condition is caused by: A direct hit to your head, such as: Running into another player during a game. Being hit in a fight. Hitting  your head on a hard surface. Sudden movement of your body that causes your brain to move back and forth inside the skull, such as in a car crash. What are the signs or symptoms? The signs of a concussion can be hard to notice. Early on, they may be missed by you, family members, and health care providers. You may look fine on theoutside but may act or feel differently. Every head injury is different. Symptoms are usually temporary but may last for days, weeks, or even months. Some symptoms appear right away, but other symptoms may not show up for hours or days. If your symptoms last longer thannormal, you may have post-concussion syndrome. Physical symptoms Headaches. Dizziness and problems with coordination or balance. Sensitivity to light or noise. Nausea or vomiting. Tiredness (fatigue). Vision or hearing problems. Changes in eating or sleeping patterns. Seizure. Mental and emotional symptoms Irritability or mood changes. Memory problems. Trouble concentrating, organizing, or making decisions. Slowness in thinking, acting or reacting, speaking, or reading. Anxiety or depression. How is this diagnosed? This condition is diagnosed based on: Your symptoms. A description of your injury. You may also have tests, including: Imaging tests, such as a CT scan or an MRI. Neuropsychological tests. These measure your thinking, understanding, learning, and remembering abilities. How is this treated? Treatment for this condition includes: Stopping sports or activity if you are injured. If you hit your head or show signs of concussion: Do not return to sports or activities the same day. Get checked by a health care provider before you return to your activities. Physical and mental rest and careful observation, usually at home. Gradually return to your normal activities. Medicines to help with symptoms such as headaches, nausea, or difficulty sleeping. Avoid taking opioid pain medicine while  recovering from a concussion. Avoiding alcohol and drugs. These may slow your recovery and can put you at risk of further injury. Referral to a concussion clinic or rehabilitation center. Recovery from a concussion can take time. How fast you recover depends on many factors. Return to activities only when: Your symptoms are completely gone. Your health care provider says that it is safe. Follow these instructions at home: Activity Limit activities that require a lot of thought or concentration, such as: Doing homework or job-related work. Watching TV. Working on the computer or phone. Playing memory games and puzzles. Rest. Rest helps your brain heal. Make sure you: Get plenty of sleep. Most adults should get 7-9 hours of sleep each night. Rest during the day. Take naps or rest breaks when you feel tired. Avoid physical activity like exercise until your health care provider says it is safe. Stop any activity that worsens symptoms. Do not do high-risk activities that could cause a second concussion, such as riding a bike or playing sports. Ask your health care provider when you can return to your normal activities, such as school, work, athletics, and driving. Your ability to react may be slower after a brain injury. Never do these activities if you are dizzy. Your health care provider will likely give you a plan for gradually returning to activities. General instructions  Take over-the-counter and prescription medicines  only as told by your health care provider. Some medicines, such as blood thinners (anticoagulants) and aspirin, may increase the risk for complications, such as bleeding. Do not drink alcohol until your health care provider says you can. Watch your symptoms and tell others around you to do the same. Complications sometimes occur after a concussion. Older adults with a brain injury may have a higher risk of serious complications. Tell your workProduction designer, theatre/television/filmager, teachers, Tax adviser,  school counselor, coach, or Event organiser about your injury, symptoms, and restrictions. Keep all follow-up visits as told by your health care provider. This is important.  How is this prevented? Avoiding another brain injury is very important. In rare cases, another injury can lead to permanent brain damage, brain swelling, or death. The risk of this is greatest during the first 7-10 days after a head injury. Avoid injuries by: Stopping activities that could lead to a second concussion, such as contact or recreational sports, until your health care provider says it is okay. Taking these actions once you have returned to sports or activities: Avoiding plays or moves that can cause you to crash into another person. This is how most concussions occur. Following the rules and being respectful of other players. Do not engage in violent or illegal plays. Getting regular exercise that includes strength and balance training. Wearing a properly fitting helmet during sports, biking, or other activities. Helmets can help protect you from serious skull and brain injuries, but they may not protect you from a concussion. Even when wearing a helmet, you should avoid being hit in the head. Contact a health care provider if: Your symptoms do not improve. You have new symptoms. You have another injury. Get help right away if: You have new or worsening physical symptoms, such as: A severe or worsening headache. Weakness or numbness in any part of your body, slurred speech, vision changes, or confusion. Your coordination gets worse. Vomiting repeatedly. You have a seizure. You have unusual behavior changes. You lose consciousness, are sleepier than normal, or are difficult to wake up. These symptoms may represent a serious problem that is an emergency. Do not wait to see if the symptoms will go away. Get medical help right away. Call your local emergency services (911 in the [place] Do not drive yourself to  the hospital. Summary A concussion is a brain injury that results from a hard, direct hit (trauma) to your head or body. You may have imaging tests and neuropsychological tests to diagnose a concussion. Treatment for this condition includes physical and mental rest and careful observation. Ask your health care provider when you can return to your normal activities, such as school, work, athletics, and driving. Get help right away if you have a severe headache, weakness in any part of the body, seizures, behavior changes, changes in vision, or if you are confused or sleepier than normal. This information is not intended to replace advice given to you by your health care provider. Make sure you discuss any questions you have with your healthcare provider. Document Revised: 03/20/2019 Document Reviewed: 03/20/2019 Elsevier Patient Education  2022 [employer].
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
## 2021-03-20 ENCOUNTER — Other Ambulatory Visit: Payer: Self-pay | Admitting: Internal Medicine

## 2021-03-20 DIAGNOSIS — E782 Mixed hyperlipidemia: Secondary | ICD-10-CM

## 2021-07-13 NOTE — Progress Notes (Signed)
I, [name], LAT, ATC, am serving as scribe for Dr. [name].  Kevin [name] is a 46 y.o. male who presents to [facility] at [facility] today for f/u of L shoulder pain due to subacromial bursitis, rotator cuff tendinitis, and mild AC DJD.  He was last seen by Dr. [name] on 06/03/20 and was shown a HEP and referred to [facility]. Today, pt reports L shoulder pain had resolved, but started hurting again around [facility]. Pt resumed PT and has completed 5 additional visits from 06/07/21 through 07/06/21. Pt locates pain to the posterior and superior aspect of the L GH joint.  He has continued his home exercise program previously taught here in clinic from January 1 till now.    Diagnostic testing: L shoulder XR- 09/20/17  Pertinent review of systems: No fevers or chills  Relevant historical information: Avid weightlifter.   Exam:  BP (!) 148/88    Pulse 63    Ht 5\' 10"  (1.778 m)    Wt 223 lb 3.2 oz (101.2 kg)    SpO2 96%    BMI 32.03 kg/m  General: Well Developed, well nourished, and in no acute distress.   MSK: Left shoulder: Normal-appearing Nontender. Normal motion pain with abduction and functional internal rotation. Positive Hawkins and Neer's test. Positive empty can test. Strength abduction 4+/5.  Intact otherwise. Negative Yergason's and speeds test.. Pulses cap refill and sensation are intact distally.    Lab and Radiology Results  Procedure: Real-time Ultrasound Guided Injection of left shoulder subacromial bursa Device: Philips Affiniti 50G Images permanently stored and available for review in PACS Ultrasound evaluation prior to injection reveals moderate subacromial bursitis but intact rotator cuff tendons. Verbal informed consent obtained.  Discussed risks and benefits of procedure. Warned about infection bleeding damage to structures skin hypopigmentation and fat atrophy among others. Patient expresses understanding and agreement Time-out  conducted.   Noted no overlying erythema, induration, or other signs of local infection.   Skin prepped in a sterile fashion.   Local anesthesia: Topical Ethyl chloride.   With sterile technique and under real time ultrasound guidance: 40 mg of Kenalog and 2 mL of Marcaine injected into subacromial bursa. Fluid seen entering the bursa.   Completed without difficulty   Pain immediately resolved suggesting accurate placement of the medication.   Advised to call if fevers/chills, erythema, induration, drainage, or persistent bleeding.   Images permanently stored and available for review in the ultrasound unit.  Impression: Technically successful ultrasound guided injection.     X-ray images left shoulder obtained today personally and independently interpreted No acute fractures.  AC DJD. Await formal radiology review    Assessment and Plan: 46 y.o. male with left shoulder pain thought to be due to subacromial bursitis.  This is a chronic issue with an acute exacerbation ongoing for the last 7 weeks plus.  He has been trying conservative management strategies since the last 7 weeks with home exercise program taught in clinic previously and with physical therapy.  At this point he is not doing as well as he would like.  Plan for trial of subacromial injection and repeat x-ray.  If not improved would recommend MRI.  He will let me know.   PDMP not reviewed this encounter. Orders Placed This Encounter  Procedures   DG Shoulder Left    Standing Status:   Future    Number of Occurrences:   1    Standing Expiration Date:   07/14/2022  Order Specific Question:   Reason for Exam (SYMPTOM  OR DIAGNOSIS REQUIRED)    Answer:   left shoulder pain    Order Specific Question:   Preferred imaging location?    Answer:   [facility]   [place] LIMITED JOINT SPACE STRUCTURES UP LEFT(NO LINKED CHARGES)    Order Specific Question:   Reason for Exam (SYMPTOM  OR DIAGNOSIS REQUIRED)    Answer:   left  shoulder    Order Specific Question:   Preferred imaging location?    Answer:    [facility]   No orders of the defined types were placed in this encounter.    Discussed warning signs or symptoms. Please see discharge instructions. Patient expresses understanding.   The above documentation has been reviewed and is accurate and complete [name], M.D.

## 2021-07-14 ENCOUNTER — Ambulatory Visit: Payer: Self-pay

## 2021-07-14 ENCOUNTER — Ambulatory Visit: Payer: 59 | Admitting: Family Medicine

## 2021-07-14 ENCOUNTER — Ambulatory Visit (INDEPENDENT_AMBULATORY_CARE_PROVIDER_SITE_OTHER): Payer: 59

## 2021-07-14 ENCOUNTER — Other Ambulatory Visit: Payer: Self-pay

## 2021-07-14 VITALS — BP 148/88 | HR 63 | Ht 70.0 in | Wt 223.2 lb

## 2021-07-14 DIAGNOSIS — G8929 Other chronic pain: Secondary | ICD-10-CM

## 2021-07-14 DIAGNOSIS — M25512 Pain in left shoulder: Secondary | ICD-10-CM | POA: Diagnosis not present

## 2021-07-14 NOTE — Patient Instructions (Addendum)
Thank you for coming in today.   Please get an Xray today before you leave   You received a steroid injection in your left shoulder today. Seek immediate medical attention if the joint becomes red, extremely painful, or is oozing fluid.   If not improving, let me know, and we will proceed to MRI

## 2021-07-18 NOTE — Progress Notes (Signed)
Left shoulder x-ray shows arthritis of the acromioclavicular joint.  The main shoulder joint looks normal.

## 2021-09-05 ENCOUNTER — Encounter: Payer: Self-pay | Admitting: Family Medicine

## 2021-09-05 ENCOUNTER — Ambulatory Visit: Payer: 59 | Admitting: Family Medicine

## 2021-09-05 VITALS — BP 140/96 | HR 64 | Ht 70.0 in | Wt 219.8 lb

## 2021-09-05 DIAGNOSIS — G8929 Other chronic pain: Secondary | ICD-10-CM | POA: Diagnosis not present

## 2021-09-05 DIAGNOSIS — M25512 Pain in left shoulder: Secondary | ICD-10-CM | POA: Diagnosis not present

## 2021-09-05 NOTE — Patient Instructions (Addendum)
Good to see you today. ? ?I've ordered a L shoulder MRI.  That facility will call you to schedule but please let us know if you don't hear from them in one week regarding scheduling. ? ?Follow-up: after MRI ?

## 2021-09-05 NOTE — Progress Notes (Signed)
? ?I, [name], LAT, ATC, am serving as scribe for Dr. [name]. ? ?Kevin [name] is a 46 y.o. male who presents to [facility] at [facility] today for continued L shoulder pain, thought to be due to subacromial bursitis. Pt was last seen by Dr. [name] on 07/14/21 and was given a L subacromial steroid injection. Today, pt reports that the L shoulder pain returned about 2-3 weeks after his injection.  He locates the majority of his pain to his L post-lat shoulder.  He notes weakness w/ L shoulder ER and also has pain w/ resisted overhead AROM. ?He notes clicking and popping with shoulder motion. ? ?Dx imaging: 07/14/21 L shoulder XR ? 11/21/20 C-spine CT ? 09/20/17 L shoulder XR ? ?Pertinent review of systems: No fevers or chills ? 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 ? ?Exam:  ?BP (!) 140/96 (BP Location: Left Arm, Patient Position: Sitting, Cuff Size: Normal)   Pulse 64   Ht 5\' 10"  (1.778 m)   Wt 219 lb 12.8 oz (99.7 kg)   SpO2 96%   BMI 31.54 kg/m?  ?General: Well Developed, well nourished, and in no acute distress.  ? ?MSK: Left shoulder normal. ?Tender palpation mildly at [facility] joint. ?Range of motion: ?Abduction: 120 degrees.  External rotation full internal rotation lumbar spine. ?Positive Hawkins and Neer's test.  Positive empty can test. ?Negative Yergason's and speeds test. ?Positive O'Brien's and clunk test. ?Strength: 4/5 abduction and external rotation.  5/5 internal rotation. ? ? ? ?Lab and Radiology Results ? ?EXAM: ?LEFT SHOULDER - 2+ VIEW ?  ?COMPARISON:  09/20/2017 ?  ?FINDINGS: ?Mild clavicular joint space narrowing and peripheral osteophytosis ?degenerative change. Glenohumeral joint space is maintained. No ?acute fracture or dislocation. ?  ?IMPRESSION: ?Mild left acromioclavicular osteoarthritis, similar to prior. ?  ?  ?Electronically Signed ?  By: 11/20/2017 M.D. ?  On: 07/15/2021 18:42 ?  ?I, 07/17/2021, personally (independently) visualized and  performed the interpretation of the images attached in this note. ? ? ? ?Assessment and Plan: ?46 y.o. male with left shoulder pain.  Patient has failed trial of conservative management including physical therapy and injection.  At this point I am concerned he has a rotator cuff tear or perhaps a labrum tear given his mechanical symptoms.  Plan for MRI arthrogram to further evaluate source of pain and for surgical planning.  Recheck after MRI or if surgery is likely to be the case we can refer directly to orthopedic surgery. ? ? ?PDMP not reviewed this encounter. ?Orders Placed This Encounter  ?Procedures  ? MR Shoulder Left W Contrast  ?  L shoulder MRI arthrogram w/ no IV contrast.  Please schedule w/ Dr. 57 one hour prior for injection.  ?  Standing Status:   Future  ?  Standing Expiration Date:   09/06/2022  ?  Scheduling Instructions:  ?   L shoulder MRI arthrogram w/ no IV contrast.  Please schedule w/ Dr. 09/08/2022 one hour prior for injection.  ?  Order Specific Question:   Reason for Exam (SYMPTOM  OR DIAGNOSIS REQUIRED)  ?  Answer:   L shoulder labral tear  ?  Order Specific Question:   If indicated for the ordered procedure, I authorize the administration of contrast media per Radiology protocol  ?  Answer:   Yes  ?  Order Specific Question:   What is the patient's sedation requirement?  ?  Answer:   No Sedation  ?  Order Specific Question:   Does the patient have a pacemaker or implanted devices?  ?  Answer:   No  ?  Order Specific Question:   Preferred imaging location?  ?  Answer:   Product/process development scientist (table limit-350lbs)  ? ?No orders of the defined types were placed in this encounter. ? ? ? ?Discussed warning signs or symptoms. Please see discharge instructions. Patient expresses understanding. ? ? ?The above documentation has been reviewed and is accurate and complete Lynne Leader, M.D. ? ? ?

## 2021-09-12 ENCOUNTER — Other Ambulatory Visit: Payer: Self-pay | Admitting: Family Medicine

## 2021-09-12 DIAGNOSIS — Z0189 Encounter for other specified special examinations: Secondary | ICD-10-CM

## 2021-09-13 ENCOUNTER — Ambulatory Visit: Payer: 59 | Admitting: Sports Medicine

## 2021-09-20 ENCOUNTER — Ambulatory Visit: Payer: 59 | Admitting: Sports Medicine

## 2021-09-20 ENCOUNTER — Other Ambulatory Visit: Payer: 59

## 2021-09-26 ENCOUNTER — Ambulatory Visit: Payer: 59 | Admitting: Sports Medicine

## 2021-09-26 ENCOUNTER — Ambulatory Visit (INDEPENDENT_AMBULATORY_CARE_PROVIDER_SITE_OTHER): Payer: 59

## 2021-09-26 ENCOUNTER — Ambulatory Visit (INDEPENDENT_AMBULATORY_CARE_PROVIDER_SITE_OTHER): Payer: 59 | Admitting: Sports Medicine

## 2021-09-26 DIAGNOSIS — M25512 Pain in left shoulder: Secondary | ICD-10-CM

## 2021-09-26 DIAGNOSIS — Z0189 Encounter for other specified special examinations: Secondary | ICD-10-CM

## 2021-09-26 DIAGNOSIS — G8929 Other chronic pain: Secondary | ICD-10-CM

## 2021-09-26 DIAGNOSIS — Z77018 Contact with and (suspected) exposure to other hazardous metals: Secondary | ICD-10-CM

## 2021-09-26 NOTE — Assessment & Plan Note (Signed)
Referred to me for arthrography of the left shoulder, arthrogram injection performed today, further management per primary treating provider. ?

## 2021-09-26 NOTE — Progress Notes (Signed)
? ? ?  Procedures performed today:   ? ?Procedure: Real-time Ultrasound Guided gadolinium contrast injection of left glenohumeral joint ?Device: Samsung HS60  ?Verbal informed consent obtained.  ?Time-out conducted.  ?Noted no overlying erythema, induration, or other signs of local infection.  ?Skin prepped in a sterile fashion.  ?Local anesthesia: Topical Ethyl chloride.  ?With sterile technique and under real time ultrasound guidance: I advanced a 22-gauge spinal needle into the glenohumeral joint from a posterior approach, noted normal-appearing joint, I then injected 1 cc kenalog 40, 2 cc lidocaine, 2 cc bupivacaine, syringe switched and 0.1 cc gadolinium injected, surgical switched and 10 cc sterile saline used to fully distend the joint. ?Joint visualized and capsule seen distending confirming intra-articular placement of contrast material and medication. ?Completed without difficulty  ?Advised to call if fevers/chills, erythema, induration, drainage, or persistent bleeding.  ?Images permanently stored in PACS ?Impression: Technically successful ultrasound guided gadolinium contrast injection for MR arthrography.  Please see separate MR arthrogram report. ? ?Independent interpretation of notes and tests performed by another provider:  ? ?None. ? ?Brief History, Exam, Impression, and Recommendations:   ? ?Chronic left shoulder pain ?Referred to me for arthrography of the left shoulder, arthrogram injection performed today, further management per primary treating provider. ? ? ? ?___________________________________________ ?Kevin [name]. Benjamin Stain, M.D., ABFM., CAQSM. ?Primary Care and Sports Medicine ?[facility] ? ?Adjunct Instructor of Family Medicine  ?[employer] ?

## 2021-09-27 NOTE — Progress Notes (Signed)
No metallic foreign body in the eye.  Okay to proceed to MRI.

## 2021-09-28 ENCOUNTER — Encounter: Payer: Self-pay | Admitting: Family Medicine

## 2021-09-28 NOTE — Progress Notes (Signed)
Shoulder MRI shows rotator cuff tears mostly of the rotator cuff tendon that brings your  arm overhead.  In my opinion this is something that would potentially benefit from surgery.  I think he should have a surgical consultation.  Would you like me to refer you to orthopedic surgery now or would you like to have a visit with me to talk about the results of the MRI in full detail and discuss treatment plan and options further?

## 2021-10-03 ENCOUNTER — Ambulatory Visit: Payer: 59 | Admitting: Family Medicine

## 2021-10-03 VITALS — BP 150/88 | HR 69 | Ht 70.0 in | Wt 217.8 lb

## 2021-10-03 DIAGNOSIS — M75112 Incomplete rotator cuff tear or rupture of left shoulder, not specified as traumatic: Secondary | ICD-10-CM

## 2021-10-03 DIAGNOSIS — G8929 Other chronic pain: Secondary | ICD-10-CM | POA: Diagnosis not present

## 2021-10-03 DIAGNOSIS — M25512 Pain in left shoulder: Secondary | ICD-10-CM

## 2021-10-03 NOTE — Patient Instructions (Addendum)
Thank you for coming in today.  ? ?Get a CD made of the MRI images.  ?[phone] ?This will be helpful if you want the guy at [facility] to do it. Dr Mack Guise. ? ?I do recommend you see him long before you schedule surgery.  ? ?Let me know if you need anything. ? ?  ? ? ?

## 2021-10-03 NOTE — Progress Notes (Signed)
? ?I, Peterson Lombard, LAT, ATC acting as a scribe for Lynne Leader, MD. ? ?Kevin [name] is a 46 y.o. male who presents to [facility] today for f/u L shoulder pain and MRI review. Pt was last seen by Dr. Georgina Snell on 09/05/21 and since he has failed conservative management, a MRI arthrogram was ordered. Today, pt reports the steroid injection that he received as part of the arthrogram helped his shoulder. ? ?Dx imaging: 09/26/21 L shoulder MRI ? 11/21/20 C-spine CT ? 07/14/21 L shoulder XR ? 09/20/17 L shoulder XR ? ?Pertinent review of systems: No fevers or chills ? ?Relevant historical information: History of right shoulder rotator cuff repair by Dr. Francia Greaves at [place] about 7 years ago. ? ? ?Exam:  ?BP (!) 150/88   Pulse 69   Ht 5\' 10"  (1.778 m)   Wt 217 lb 12.8 oz (98.8 kg)   SpO2 96%   BMI 31.25 kg/m?  ?General: Well Developed, well nourished, and in no acute distress.  ? ?MSK: Left shoulder normal motion pain with abduction. ? ? ? ?Lab and Radiology Results ?EXAM: ?MR ARTHROGRAM OF THE left shoulder radiographs 07/14/2021 SHOULDER ?  ?TECHNIQUE: ?Multiplanar, multisequence MR imaging of the left shoulder was ?performed following the administration of intra-articular contrast. ?  ?CONTRAST:  See Injection Documentation. ?  ?COMPARISON:  None Available. ?  ?FINDINGS: ?Rotator cuff: There is a full-thickness tear of the anterior ?supraspinatus tendon footprint in a region measuring up to 1.5 cm in ?AP dimension (sagittal series 7, image 14) with approximately 1.2 cm ?tendon retraction (coronal series 5, image 12). The infraspinatus is ?intact. Possible punctate only 2 mm partial-thickness tear of the ?superior subscapularis tendon insertion (sagittal series 7, image 11 ?and axial series 3, image 9. The teres minor is intact. ?  ?Muscles: No rotator cuff muscle atrophy, fatty infiltration, or ?edema. ?  ?Biceps long head: The intra-articular long head of the biceps tendon ?is  intact. ?  ?Acromioclavicular Joint: There are mild degenerative changes of the ?acromioclavicular joint including joint space narrowing, subchondral ?marrow edema, and peripheral osteophytosis. Type II acromion. ?  ?Mild fluid within the subacromial/subdeltoid bursa extending from ?the glenohumeral joint. ?  ?Glenohumeral Joint: Intact cartilage. ?  ?Labrum: No definite glenoid labral tear is visualized. There is mild ?intermediate T2 signal in the region of the anterior inferior ?glenoid labrum on a single axial image only (axial series 3 image ?12) which may reflect a tiny tear versus normal insertion of the ?glenohumeral ligament on the anterior inferior glenoid labrum. ?  ?Bones:  No acute fracture. ?  ?Other: None. ?  ?IMPRESSION:: ?IMPRESSION: ?1. Full-thickness tear of the anterior supraspinatus tendon ?measuring up to 1.5 cm in AP dimension. ?2. Possible 2 mm partial-thickness tear of the superior ?subscapularis tendon. ?3. Mild degenerative changes of the acromioclavicular joint. ?4. No definite glenoid labral tear. A single axial image only there ?is intermediate T1 signal which may reflect a tiny anterior inferior ?glenoid labral tear versus normal insertion of the middle ?glenohumeral ligament on the anterior inferior glenoid labrum. ?  ?  ?Electronically Signed ?  By: Yvonne Kendall M.D. ?  On: 09/27/2021 19:53 ?I, Lynne Leader, personally (independently) visualized and performed the interpretation of the images attached in this note. ? ? ? ? ? ?Assessment and Plan: ?46 y.o. male with chronic left shoulder pain.  Patient has failed conservative management including physical therapy and several injections.  Eventually we proceeded to MRI arthrogram  which ultimately did show partial supraspinatus rotator cuff tear which is likely the source of his pain.  As part of the arthrogram we did have a steroid injection which has now improved his pain at least temporarily.  I do not think this is going to provide  lasting benefit.  I think his best option at this point is surgery or at least a surgical consultation.  He would like to return back to Dr. Francia Greaves who did his right shoulder surgery about 7 years ago.  That he is at [place].  He would like to delay his shoulder surgery till later this year if possible which I think is an option.  Plan for surgery referral and check back with me as needed.  Recommend that he get a CD of his MRI made prior to his visit. ? ? ?PDMP not reviewed this encounter. ?Orders Placed This Encounter  ?Procedures  ? Ambulatory referral to Orthopedic Surgery  ?  Referral Priority:   Routine  ?  Referral Type:   Surgical  ?  Referral Reason:   Specialty Services Required  ?  Referred to Provider:   Julian Hy, MD  ?  Requested Specialty:   Orthopedic Surgery  ?  Number of Visits Requested:   1  ? ?No orders of the defined types were placed in this encounter. ? ? ? ?Discussed warning signs or symptoms. Please see discharge instructions. Patient expresses understanding. ? ? ?The above documentation has been reviewed and is accurate and complete Lynne Leader, M.D. ? ? ?Total encounter time 20 minutes including face-to-face time with the patient and, reviewing past medical record, and charting on the date of service.   ?Reviewed MRI and discussed treatment plan and options. ? ?

## 2021-10-18 ENCOUNTER — Ambulatory Visit (INDEPENDENT_AMBULATORY_CARE_PROVIDER_SITE_OTHER): Payer: 59 | Admitting: Internal Medicine

## 2021-10-18 ENCOUNTER — Other Ambulatory Visit: Payer: 59

## 2021-10-18 ENCOUNTER — Encounter: Payer: Self-pay | Admitting: Internal Medicine

## 2021-10-18 VITALS — BP 122/82 | HR 68 | Temp 98.2°F | Resp 14 | Ht 70.0 in | Wt 214.6 lb

## 2021-10-18 DIAGNOSIS — E782 Mixed hyperlipidemia: Secondary | ICD-10-CM

## 2021-10-18 DIAGNOSIS — F32A Depression, unspecified: Secondary | ICD-10-CM

## 2021-10-18 DIAGNOSIS — Z1329 Encounter for screening for other suspected endocrine disorder: Secondary | ICD-10-CM | POA: Diagnosis not present

## 2021-10-18 DIAGNOSIS — Z0000 Encounter for general adult medical examination without abnormal findings: Secondary | ICD-10-CM | POA: Diagnosis not present

## 2021-10-18 DIAGNOSIS — Z1389 Encounter for screening for other disorder: Secondary | ICD-10-CM | POA: Diagnosis not present

## 2021-10-18 DIAGNOSIS — E559 Vitamin D deficiency, unspecified: Secondary | ICD-10-CM

## 2021-10-18 DIAGNOSIS — F419 Anxiety disorder, unspecified: Secondary | ICD-10-CM

## 2021-10-18 DIAGNOSIS — D72821 Monocytosis (symptomatic): Secondary | ICD-10-CM

## 2021-10-18 DIAGNOSIS — E785 Hyperlipidemia, unspecified: Secondary | ICD-10-CM

## 2021-10-18 DIAGNOSIS — R0683 Snoring: Secondary | ICD-10-CM

## 2021-10-18 DIAGNOSIS — R7989 Other specified abnormal findings of blood chemistry: Secondary | ICD-10-CM

## 2021-10-18 DIAGNOSIS — B009 Herpesviral infection, unspecified: Secondary | ICD-10-CM

## 2021-10-18 DIAGNOSIS — Z125 Encounter for screening for malignant neoplasm of prostate: Secondary | ICD-10-CM

## 2021-10-18 LAB — CBC WITH DIFFERENTIAL/PLATELET
Basophils Absolute: 0 10*3/uL (ref 0.0–0.1)
Basophils Relative: 0.5 % (ref 0.0–3.0)
Eosinophils Absolute: 0.2 10*3/uL (ref 0.0–0.7)
Eosinophils Relative: 3.3 % (ref 0.0–5.0)
HCT: 49.6 % (ref 39.0–52.0)
Hemoglobin: 17 g/dL (ref 13.0–17.0)
Lymphocytes Relative: 41.6 % (ref 12.0–46.0)
Lymphs Abs: 2.1 10*3/uL (ref 0.7–4.0)
MCHC: 34.3 g/dL (ref 30.0–36.0)
MCV: 96.7 fl (ref 78.0–100.0)
Monocytes Absolute: 0.7 10*3/uL (ref 0.1–1.0)
Monocytes Relative: 13.8 % — ABNORMAL HIGH (ref 3.0–12.0)
Neutro Abs: 2.1 10*3/uL (ref 1.4–7.7)
Neutrophils Relative %: 40.8 % — ABNORMAL LOW (ref 43.0–77.0)
Platelets: 210 10*3/uL (ref 150.0–400.0)
RBC: 5.13 Mil/uL (ref 4.22–5.81)
RDW: 13.2 % (ref 11.5–15.5)
WBC: 5.1 10*3/uL (ref 4.0–10.5)

## 2021-10-18 LAB — COMPREHENSIVE METABOLIC PANEL
ALT: 34 U/L (ref 0–53)
AST: 27 U/L (ref 0–37)
Albumin: 4.6 g/dL (ref 3.5–5.2)
Alkaline Phosphatase: 55 U/L (ref 39–117)
BUN: 16 mg/dL (ref 6–23)
CO2: 28 mEq/L (ref 19–32)
Calcium: 9.4 mg/dL (ref 8.4–10.5)
Chloride: 105 mEq/L (ref 96–112)
Creatinine, Ser: 1.12 mg/dL (ref 0.40–1.50)
GFR: 77.98 mL/min (ref >60.00)
Glucose, Bld: 101 mg/dL — ABNORMAL HIGH (ref 70–99)
Potassium: 4.2 mEq/L (ref 3.5–5.1)
Sodium: 140 mEq/L (ref 135–145)
Total Bilirubin: 0.6 mg/dL (ref 0.2–1.2)
Total Protein: 6.9 g/dL (ref 6.0–8.3)

## 2021-10-18 LAB — VITAMIN D 25 HYDROXY (VIT D DEFICIENCY, FRACTURES): VITD: 26.3 ng/mL — ABNORMAL LOW (ref 30.00–100.00)

## 2021-10-18 LAB — LIPID PANEL
Cholesterol: 155 mg/dL (ref 0–200)
HDL: 30.5 mg/dL — ABNORMAL LOW (ref >39.00)
NonHDL: 124.5
Total CHOL/HDL Ratio: 5
Triglycerides: 208 mg/dL — ABNORMAL HIGH (ref 0.0–149.0)
VLDL: 41.6 mg/dL — ABNORMAL HIGH (ref 0.0–40.0)

## 2021-10-18 LAB — TSH: TSH: 1.36 u[IU]/mL (ref 0.35–5.50)

## 2021-10-18 LAB — LDL CHOLESTEROL, DIRECT: Direct LDL: 83 mg/dL

## 2021-10-18 MED ORDER — FLUOXETINE HCL 20 MG PO TABS: 20.0000 mg | ORAL_TABLET | Freq: Every day | ORAL | 3 refills | Status: AC

## 2021-10-18 MED ORDER — SIMVASTATIN 40 MG PO TABS: ORAL_TABLET | ORAL | 3 refills | Status: AC

## 2021-10-18 MED ORDER — VALACYCLOVIR HCL 1 G PO TABS: 2000.0000 mg | ORAL_TABLET | Freq: Two times a day (BID) | ORAL | 3 refills | Status: AC
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
## 2021-10-18 NOTE — Addendum Note (Signed)
Addended by: Quentin Ore on: 10/18/2021 12:55 PM   Modules accepted: Orders

## 2021-10-19 LAB — URINALYSIS, ROUTINE W REFLEX MICROSCOPIC
Bilirubin Urine: NEGATIVE
Glucose, UA: NEGATIVE
Hgb urine dipstick: NEGATIVE
Ketones, ur: NEGATIVE
Leukocytes,Ua: NEGATIVE
Nitrite: NEGATIVE
Protein, ur: NEGATIVE
Specific Gravity, Urine: 1.018 (ref 1.001–1.035)
pH: 6.5 (ref 5.0–8.0)

## 2021-10-19 LAB — PSA: PSA: 1.41 ng/mL (ref <=4.00)

## 2021-10-19 LAB — PATHOLOGIST SMEAR REVIEW

## 2021-10-31 DIAGNOSIS — D72821 Monocytosis (symptomatic): Secondary | ICD-10-CM | POA: Insufficient documentation

## 2021-10-31 NOTE — Addendum Note (Signed)
Addended by: Quentin Ore on: 10/31/2021 12:06 AM   Modules accepted: Orders

## 2021-12-15 ENCOUNTER — Encounter: Payer: Self-pay | Admitting: Internal Medicine

## 2021-12-19 NOTE — Addendum Note (Signed)
Addended by: Quentin Ore on: 12/19/2021 08:14 PM   Modules accepted: Orders

## 2021-12-20 ENCOUNTER — Ambulatory Visit: Payer: 59 | Admitting: Internal Medicine

## 2021-12-20 ENCOUNTER — Encounter: Payer: Self-pay | Admitting: Internal Medicine

## 2021-12-20 VITALS — BP 128/64 | HR 79 | Temp 98.6°F | Ht 70.0 in | Wt 217.6 lb

## 2021-12-20 DIAGNOSIS — J0110 Acute frontal sinusitis, unspecified: Secondary | ICD-10-CM

## 2021-12-20 DIAGNOSIS — J309 Allergic rhinitis, unspecified: Secondary | ICD-10-CM

## 2021-12-20 DIAGNOSIS — R0982 Postnasal drip: Secondary | ICD-10-CM | POA: Diagnosis not present

## 2021-12-20 MED ORDER — SALINE SPRAY 0.65 % NA SOLN: 2.0000 | NASAL | 11 refills | Status: AC | PRN

## 2021-12-20 MED ORDER — LORATADINE 10 MG PO TABS: 10.0000 mg | ORAL_TABLET | Freq: Every day | ORAL | 3 refills | Status: AC | PRN

## 2021-12-20 MED ORDER — AMOXICILLIN-POT CLAVULANATE 875-125 MG PO TABS: 1.0000 | ORAL_TABLET | Freq: Two times a day (BID) | ORAL | 0 refills | Status: DC

## 2021-12-20 NOTE — Patient Instructions (Addendum)
Mucinex DM or Robitussin DM (dextromethophan and Guafenisin)   Dr. Jenne Pane    Phone Fax E-mail Address  [phone] Not available [address]   [address]   [place] [address]     Specialties     Otolaryngology             Dr. Elenore Rota  Phone Fax E-mail Address  [phone] Not available [address]   [address]   [place] [address]     Specialties     Otolaryngology      Sinus Infection, Adult A sinus infection, also called sinusitis, is inflammation of your sinuses. Sinuses are hollow spaces in the bones around your face. Your sinuses are located: Around your eyes. In the middle of your forehead. Behind your nose. In your cheekbones. Mucus normally drains out of your sinuses. When your nasal tissues become inflamed or swollen, mucus can become trapped or blocked. This allows bacteria, viruses, and fungi to grow, which leads to infection. Most infections of the sinuses are caused by a virus. A sinus infection can develop quickly. It can last for up to 4 weeks (acute) or for more than 12 weeks (chronic). A sinus infection often develops after a cold. What are the causes? This condition is caused by anything that creates swelling in the sinuses or stops mucus from draining. This includes: Allergies. Asthma. Infection from bacteria or viruses. Deformities or blockages in your nose or sinuses. Abnormal growths in the nose (nasal polyps). Pollutants, such as chemicals or irritants in the air. Infection from fungi. This is rare. What increases the risk? You are more likely to develop this condition if you: Have a weak body defense system (immune system). Do a lot of swimming or diving. Overuse nasal sprays. Smoke. What are the signs or symptoms? The main symptoms of this condition are pain and a feeling of pressure around the affected sinuses. Other symptoms include: Stuffy nose or congestion that makes it difficult to breathe  through your nose. Thick yellow or greenish drainage from your nose. Tenderness, swelling, and warmth over the affected sinuses. A cough that may get worse at night. Decreased sense of smell and taste. Extra mucus that collects in the throat or the back of the nose (postnasal drip) causing a sore throat or bad breath. Tiredness (fatigue). Fever. How is this diagnosed? This condition is diagnosed based on: Your symptoms. Your medical history. A physical exam. Tests to find out if your condition is acute or chronic. This may include: Checking your nose for nasal polyps. Viewing your sinuses using a device that has a light (endoscope). Testing for allergies or bacteria. Imaging tests, such as an MRI or CT scan. In rare cases, a bone biopsy may be done to rule out more serious types of fungal sinus disease. How is this treated? Treatment for a sinus infection depends on the cause and whether your condition is chronic or acute. If caused by a virus, your symptoms should go away on their own within 10 days. You may be given medicines to relieve symptoms. They include: Medicines that shrink swollen nasal passages (decongestants). A spray that eases inflammation of the nostrils (topical intranasal corticosteroids). Rinses that help get rid of thick mucus in your nose (nasal saline washes). Medicines that treat allergies (antihistamines). Over-the-counter pain relievers. If caused by bacteria, your health care provider may recommend waiting to see if your symptoms improve. Most bacterial infections will get better without antibiotic medicine. You may be  given antibiotics if you have: A severe infection. A weak immune system. If caused by narrow nasal passages or nasal polyps, surgery may be needed. Follow these instructions at home: Medicines Take, use, or apply over-the-counter and prescription medicines only as told by your health care provider. These may include nasal sprays. If you were  prescribed an antibiotic medicine, take it as told by your health care provider. Do not stop taking the antibiotic even if you start to feel better. Hydrate and humidify  Drink enough fluid to keep your urine pale yellow. Staying hydrated will help to thin your mucus. Use a cool mist humidifier to keep the humidity level in your home above 50%. Inhale steam for 10-15 minutes, 3-4 times a day, or as told by your health care provider. You can do this in the bathroom while a hot shower is running. Limit your exposure to cool or dry air. Rest Rest as much as possible. Sleep with your head raised (elevated). Make sure you get enough sleep each night. General instructions  Apply a warm, moist washcloth to your face 3-4 times a day or as told by your health care provider. This will help with discomfort. Use nasal saline washes as often as told by your health care provider. Wash your hands often with soap and water to reduce your exposure to germs. If soap and water are not available, use hand sanitizer. Do not smoke. Avoid being around people who are smoking (secondhand smoke). Keep all follow-up visits. This is important. Contact a health care provider if: You have a fever. Your symptoms get worse. Your symptoms do not improve within 10 days. Get help right away if: You have a severe headache. You have persistent vomiting. You have severe pain or swelling around your face or eyes. You have vision problems. You develop confusion. Your neck is stiff. You have trouble breathing. These symptoms may be an emergency. Get help right away. Call 911. Do not wait to see if the symptoms will go away. Do not drive yourself to the hospital. Summary A sinus infection is soreness and inflammation of your sinuses. Sinuses are hollow spaces in the bones around your face. This condition is caused by nasal tissues that become inflamed or swollen. The swelling traps or blocks the flow of mucus. This allows  bacteria, viruses, and fungi to grow, which leads to infection. If you were prescribed an antibiotic medicine, take it as told by your health care provider. Do not stop taking the antibiotic even if you start to feel better. Keep all follow-up visits. This is important. This information is not intended to replace advice given to you by your health care provider. Make sure you discuss any questions you have with your health care provider. Document Revised: 04/12/2021 Document Reviewed: 04/12/2021 Elsevier Patient Education  2023 [employer]  Sleep Apnea Sleep apnea is a condition in which breathing pauses or becomes shallow during sleep. People with sleep apnea usually snore loudly. They may have times when they gasp and stop breathing for 10 seconds or more during sleep. This may happen many times during the night. Sleep apnea disrupts your sleep and keeps your body from getting the rest that it needs. This condition can increase your risk of certain health problems, including: Heart attack. Stroke. Obesity. Type 2 diabetes. Heart failure. Irregular heartbeat. High blood pressure. The goal of treatment is to help you breathe normally again. What are the causes?  The most common cause of sleep apnea is a  collapsed or blocked airway. There are three kinds of sleep apnea: Obstructive sleep apnea. This kind is caused by a blocked or collapsed airway. Central sleep apnea. This kind happens when the part of the brain that controls breathing does not send the correct signals to the muscles that control breathing. Mixed sleep apnea. This is a combination of obstructive and central sleep apnea. What increases the risk? You are more likely to develop this condition if you: Are overweight. Smoke. Have a smaller than normal airway. Are older. Are male. Drink alcohol. Take sedatives or tranquilizers. Have a family history of sleep apnea. Have a tongue or tonsils that are larger than  normal. What are the signs or symptoms? Symptoms of this condition include: Trouble staying asleep. Loud snoring. Morning headaches. Waking up gasping. Dry mouth or sore throat in the morning. Daytime sleepiness and tiredness. If you have daytime fatigue because of sleep apnea, you may be more likely to have: Trouble concentrating. Forgetfulness. Irritability or mood swings. Personality changes. Feelings of depression. Sexual dysfunction. This may include loss of interest if you are male, or erectile dysfunction if you are male. How is this diagnosed? This condition may be diagnosed with: A medical history. A physical exam. A series of tests that are done while you are sleeping (sleep study). These tests are usually done in a sleep lab, but they may also be done at home. How is this treated? Treatment for this condition aims to restore normal breathing and to ease symptoms during sleep. It may involve managing health issues that can affect breathing, such as high blood pressure or obesity. Treatment may include: Sleeping on your side. Using a decongestant if you have nasal congestion. Avoiding the use of depressants, including alcohol, sedatives, and narcotics. Losing weight if you are overweight. Making changes to your diet. Quitting smoking. Using a device to open your airway while you sleep, such as: An oral appliance. This is a custom-made mouthpiece that shifts your lower jaw forward. A continuous positive airway pressure (CPAP) device. This device blows air through a mask when you breathe out (exhale). A nasal expiratory positive airway pressure (EPAP) device. This device has valves that you put into each nostril. A bi-level positive airway pressure (BIPAP) device. This device blows air through a mask when you breathe in (inhale) and breathe out (exhale). Having surgery if other treatments do not work. During surgery, excess tissue is removed to create a wider  airway. Follow these instructions at home: Lifestyle Make any lifestyle changes that your health care provider recommends. Eat a healthy, well-balanced diet. Take steps to lose weight if you are overweight. Avoid using depressants, including alcohol, sedatives, and narcotics. Do not use any products that contain nicotine or tobacco. These products include cigarettes, chewing tobacco, and vaping devices, such as e-cigarettes. If you need help quitting, ask your health care provider. General instructions Take over-the-counter and prescription medicines only as told by your health care provider. If you were given a device to open your airway while you sleep, use it only as told by your health care provider. If you are having surgery, make sure to tell your health care provider you have sleep apnea. You may need to bring your device with you. Keep all follow-up visits. This is important. Contact a health care provider if: The device that you received to open your airway during sleep is uncomfortable or does not seem to be working. Your symptoms do not improve. Your symptoms get worse. Get help right  away if: You develop: Chest pain. Shortness of breath. Discomfort in your back, arms, or stomach. You have: Trouble speaking. Weakness on one side of your body. Drooping in your face. These symptoms may represent a serious problem that is an emergency. Do not wait to see if the symptoms will go away. Get medical help right away. Call your local emergency services (911 in the [place] Do not drive yourself to the hospital. Summary Sleep apnea is a condition in which breathing pauses or becomes shallow during sleep. The most common cause is a collapsed or blocked airway. The goal of treatment is to restore normal breathing and to ease symptoms during sleep. This information is not intended to replace advice given to you by your health care provider. Make sure you discuss any questions you have with  your health care provider. Document Revised: 12/15/2020 Document Reviewed: 04/16/2020 Elsevier Patient Education  2023 [employer]  Living With Sleep Apnea Sleep apnea is a condition in which breathing pauses or becomes shallow during sleep. Sleep apnea is most commonly caused by a collapsed or blocked airway. People with sleep apnea usually snore loudly. They may have times when they gasp and stop breathing for 10 seconds or more during sleep. This may happen many times during the night. The breaks in breathing also interrupt the deep sleep that you need to feel rested. Even if you do not completely wake up from the gaps in breathing, your sleep may not be restful and you feel tired during the day. You may also have a headache in the morning and low energy during the day, and you may feel anxious or depressed. How can sleep apnea affect me? Sleep apnea increases your chances of extreme tiredness during the day (daytime fatigue). It can also increase your risk for health conditions, such as: Heart attack. Stroke. Obesity. Type 2 diabetes. Heart failure. Irregular heartbeat. High blood pressure. If you have daytime fatigue as a result of sleep apnea, you may be more likely to: Perform poorly at school or work. Fall asleep while driving. Have difficulty with attention. Develop depression or anxiety. Have sexual dysfunction. What actions can I take to manage sleep apnea? Sleep apnea treatment  If you were given a device to open your airway while you sleep, use it only as told by your health care provider. You may be given: An oral appliance. This is a custom-made mouthpiece that shifts your lower jaw forward. A continuous positive airway pressure (CPAP) device. This device blows air through a mask when you breathe out (exhale). A nasal expiratory positive airway pressure (EPAP) device. This device has valves that you put into each nostril. A bi-level positive airway pressure (BIPAP)  device. This device blows air through a mask when you breathe in (inhale) and breathe out (exhale). You may need surgery if other treatments do not work for you. Sleep habits Go to sleep and wake up at the same time every day. This helps set your internal clock (circadian rhythm) for sleeping. If you stay up later than usual, such as on weekends, try to get up in the morning within 2 hours of your normal wake time. Try to get at least 7-9 hours of sleep each night. Stop using a computer, tablet, and mobile phone a few hours before bedtime. Do not take long naps during the day. If you nap, limit it to 30 minutes. Have a relaxing bedtime routine. Reading or listening to music may relax you and help you sleep. Use  your bedroom only for sleep. Keep your television and computer out of your bedroom. Keep your bedroom cool, dark, and quiet. Use a supportive mattress and pillows. Follow your health care provider's instructions for other changes to sleep habits. Nutrition Do not eat heavy meals in the evening. Do not have caffeine in the later part of the day. The effects of caffeine can last for more than 5 hours. Follow your health care provider's or dietitian's instructions for any diet changes. Lifestyle     Do not drink alcohol before bedtime. Alcohol can cause you to fall asleep at first, but then it can cause you to wake up in the middle of the night and have trouble getting back to sleep. Do not use any products that contain nicotine or tobacco. These products include cigarettes, chewing tobacco, and vaping devices, such as e-cigarettes. If you need help quitting, ask your health care provider. Medicines Take over-the-counter and prescription medicines only as told by your health care provider. Do not use over-the-counter sleep medicine. You can become dependent on this medicine, and it can make sleep apnea worse. Do not use medicines, such as sedatives and narcotics, unless told by your  health care provider. Activity Exercise on most days, but avoid exercising in the evening. Exercising near bedtime can interfere with sleeping. If possible, spend time outside every day. Natural light helps regulate your circadian rhythm. General information Lose weight if you need to, and maintain a healthy weight. Keep all follow-up visits. This is important. If you are having surgery, make sure to tell your health care provider that you have sleep apnea. You may need to bring your device with you. Where to find more information Learn more about sleep apnea and daytime fatigue from: American Sleep Association: sleepassociation.org National Sleep Foundation: sleepfoundation.org National Heart, Lung, and Blood Institute: [employer] Summary Sleep apnea is a condition in which breathing pauses or becomes shallow during sleep. Sleep apnea can cause daytime fatigue and other serious health conditions. You may need to wear a device while sleeping to help keep your airway open. If you are having surgery, make sure to tell your health care provider that you have sleep apnea. You may need to bring your device with you. Making changes to sleep habits, diet, lifestyle, and activity can help you manage sleep apnea. This information is not intended to replace advice given to you by your health care provider. Make sure you discuss any questions you have with your health care provider. Document Revised: 12/15/2020 Document Reviewed: 04/16/2020 Elsevier Patient Education  2023 [employer].
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
## 2021-12-29 ENCOUNTER — Encounter: Payer: Self-pay | Admitting: Family Medicine

## 2022-04-05 ENCOUNTER — Ambulatory Visit (INDEPENDENT_AMBULATORY_CARE_PROVIDER_SITE_OTHER): Payer: 59 | Admitting: Pulmonary Disease

## 2022-04-05 ENCOUNTER — Encounter: Payer: Self-pay | Admitting: Pulmonary Disease

## 2022-04-05 VITALS — BP 130/80 | HR 73 | Temp 98.0°F | Ht 70.0 in | Wt 226.0 lb

## 2022-04-05 DIAGNOSIS — G4733 Obstructive sleep apnea (adult) (pediatric): Secondary | ICD-10-CM | POA: Diagnosis not present

## 2022-04-05 MED ORDER — AZELASTINE-FLUTICASONE 137-50 MCG/ACT NA SUSP: 2.0000 | Freq: Every day | NASAL | 3 refills | Status: DC

## 2022-04-05 NOTE — Patient Instructions (Signed)
Schedule split-night sleep study  Prescription for Dymista nasal spray sent to pharmacy for you  Over-the-counter alternative to Dymista will be Flonase/Nasonex/Rhinocort withdrawal steroids, Astepro is the other agent and Dymista  Graded activities for weight loss as tolerated  Follow-up in 3 to 4 months   Living With Sleep Apnea Sleep apnea is a condition in which breathing pauses or becomes shallow during sleep. Sleep apnea is most commonly caused by a collapsed or blocked airway. People with sleep apnea usually snore loudly. They may have times when they gasp and stop breathing for 10 seconds or more during sleep. This may happen many times during the night. The breaks in breathing also interrupt the deep sleep that you need to feel rested. Even if you do not completely wake up from the gaps in breathing, your sleep may not be restful and you feel tired during the day. You may also have a headache in the morning and low energy during the day, and you may feel anxious or depressed. How can sleep apnea affect me? Sleep apnea increases your chances of extreme tiredness during the day (daytime fatigue). It can also increase your risk for health conditions, such as: Heart attack. Stroke. Obesity. Type 2 diabetes. Heart failure. Irregular heartbeat. High blood pressure. If you have daytime fatigue as a result of sleep apnea, you may be more likely to: Perform poorly at school or work. Fall asleep while driving. Have difficulty with attention. Develop depression or anxiety. Have sexual dysfunction. What actions can I take to manage sleep apnea? Sleep apnea treatment  If you were given a device to open your airway while you sleep, use it only as told by your health care provider. You may be given: An oral appliance. This is a custom-made mouthpiece that shifts your lower jaw forward. A continuous positive airway pressure (CPAP) device. This device blows air through a mask when you  breathe out (exhale). A nasal expiratory positive airway pressure (EPAP) device. This device has valves that you put into each nostril. A bi-level positive airway pressure (BIPAP) device. This device blows air through a mask when you breathe in (inhale) and breathe out (exhale). You may need surgery if other treatments do not work for you. Sleep habits Go to sleep and wake up at the same time every day. This helps set your internal clock (circadian rhythm) for sleeping. If you stay up later than usual, such as on weekends, try to get up in the morning within 2 hours of your normal wake time. Try to get at least 7-9 hours of sleep each night. Stop using a computer, tablet, and mobile phone a few hours before bedtime. Do not take long naps during the day. If you nap, limit it to 30 minutes. Have a relaxing bedtime routine. Reading or listening to music may relax you and help you sleep. Use your bedroom only for sleep. Keep your television and computer out of your bedroom. Keep your bedroom cool, dark, and quiet. Use a supportive mattress and pillows. Follow your health care provider's instructions for other changes to sleep habits. Nutrition Do not eat heavy meals in the evening. Do not have caffeine in the later part of the day. The effects of caffeine can last for more than 5 hours. Follow your health care provider's or dietitian's instructions for any diet changes. Lifestyle     Do not drink alcohol before bedtime. Alcohol can cause you to fall asleep at first, but then it can cause you to  wake up in the middle of the night and have trouble getting back to sleep. Do not use any products that contain nicotine or tobacco. These products include cigarettes, chewing tobacco, and vaping devices, such as e-cigarettes. If you need help quitting, ask your health care provider. Medicines Take over-the-counter and prescription medicines only as told by your health care provider. Do not use  over-the-counter sleep medicine. You can become dependent on this medicine, and it can make sleep apnea worse. Do not use medicines, such as sedatives and narcotics, unless told by your health care provider. Activity Exercise on most days, but avoid exercising in the evening. Exercising near bedtime can interfere with sleeping. If possible, spend time outside every day. Natural light helps regulate your circadian rhythm. General information Lose weight if you need to, and maintain a healthy weight. Keep all follow-up visits. This is important. If you are having surgery, make sure to tell your health care provider that you have sleep apnea. You may need to bring your device with you. Where to find more information Learn more about sleep apnea and daytime fatigue from: American Sleep Association: sleepassociation.org National Sleep Foundation: sleepfoundation.org National Heart, Lung, and Blood Institute: [employer] Summary Sleep apnea is a condition in which breathing pauses or becomes shallow during sleep. Sleep apnea can cause daytime fatigue and other serious health conditions. You may need to wear a device while sleeping to help keep your airway open. If you are having surgery, make sure to tell your health care provider that you have sleep apnea. You may need to bring your device with you. Making changes to sleep habits, diet, lifestyle, and activity can help you manage sleep apnea. This information is not intended to replace advice given to you by your health care provider. Make sure you discuss any questions you have with your health care provider. Document Revised: 12/15/2020 Document Reviewed: 04/16/2020 Elsevier Patient Education  2023 [employer].
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
## 2022-07-12 ENCOUNTER — Ambulatory Visit (HOSPITAL_BASED_OUTPATIENT_CLINIC_OR_DEPARTMENT_OTHER): Payer: 59 | Attending: Pulmonary Disease | Admitting: Pulmonary Disease

## 2022-07-12 DIAGNOSIS — R0683 Snoring: Secondary | ICD-10-CM | POA: Diagnosis not present

## 2022-07-12 DIAGNOSIS — G4733 Obstructive sleep apnea (adult) (pediatric): Secondary | ICD-10-CM | POA: Diagnosis not present

## 2022-07-12 DIAGNOSIS — R400 Somnolence: Secondary | ICD-10-CM | POA: Diagnosis not present

## 2022-07-16 ENCOUNTER — Telehealth: Payer: Self-pay | Admitting: Pulmonary Disease

## 2022-07-16 DIAGNOSIS — G4733 Obstructive sleep apnea (adult) (pediatric): Secondary | ICD-10-CM

## 2022-07-16 NOTE — Telephone Encounter (Signed)
Call patient  Sleep study result  Date of study: 07/12/2022  Impression: Moderate obstructive sleep apnea  Recommendation: DME referral  Trial of CPAP therapy on 9 cm H2O, C-Flex of 3 with a Medium size Resmed Nasal Airfit N20 mask and heated humidification.  Encourage weight loss measures  Follow-up in the office 4 to 6 weeks following initiation of treatment

## 2022-07-16 NOTE — Procedures (Signed)
POLYSOMNOGRAPHY  Last, First: Kevin, [name] MRN: [id number] Gender: Male Age (years): 77 Weight (lbs): 215 DOB: [birth year] BMI: 31 Primary Care: No PCP Epworth Score: 1 Referring: Laurin Coder MD Technician: Zadie Rhine Interpreting: Laurin Coder MD Study Type: Split Night CPAP Ordered Study Type: Split Night CPAP Study date: 07/12/2022 Location: [place] CLINICAL INFORMATION Kevin [name] is a 47 year old Male and was referred to the sleep center for evaluation of G47.30 Sleep Apnea, Unspecified (780.57). Indications include Excessive Daytime Sleepiness, Snoring, Witnessed Apneas.  MEDICATIONS Patient self administered medications include: N/A. Medications administered during study include No sleep medicine administered.  SLEEP STUDY TECHNIQUE The patient underwent an attended overnight level one polysomnography titration to assess the effects of CPAP therapy. The following variables were monitored: EEG (C4-A1, C3-A2, O1-A2, O2-A1), EOG, submental and leg EMG, ECG, oxyhemoglobin saturation by pulse oximetry, thoracic and abdominal respiratory effort belts, nasal/oral airflow by pressure sensor, body position sensor and snoring sensor. CPAP pressure was titrated to eliminate apneas, hypopneas and oxygen desaturation. Hypopneas were scored per AASM definition IB (4% desaturation)  The NPSG portion of the study ended at 1:41:15 AM . The CPAP titration was initiated at 1:45:37 AM AM with the CPAP portion of the study ending at 5:08:16 AM.  TECHNICIAN COMMENTS Comments added by Technician: Pt went to restroom once. Patient had difficulty initiating sleep. Comments added by Scorer: N/A SLEEP ARCHITECTURE The recording time for the entire night was 417.1 minutes. The diagnostic portion was initiated at 10:11:10 PM and terminated at 1:41:15 AM. The time in bed was 210.1 minutes. EEG confirmed total sleep time was 181.5 minutes yielding a sleep efficiency of 86.4%. Sleep  onset after lights out was 19.8 minutes with a REM latency of 15.5 minutes. The patient spent 4.4% of the night in stage N1 sleep, 25.3% in stage N2 sleep, 31.4% in stage N3 and 38.8% in REM. The Arousal Index was 22.1/hour.  The titration portion was initiated at 1:45:37 AM and terminated at 5:08:16 AM. The time in bed was 202.6 minutes. EEG confirmed total sleep time was 150.0 minutes yielding a sleep efficiency of 74.0%. Sleep onset after CPAP initiation was 47.9 minutes with a REM latency of 49.0 minutes. The patient spent 1.3% of the night in stage N1 sleep, 56.0% in stage N2 sleep, 8.0% in stage N3 and 34.7% in REM. The Arousal Index was 6.8/hour. RESPIRATORY PARAMETERS During the diagnostic portion, there were a total of 58 respiratory disturbances recorded; 1 apneas ( 1 obstructive, 0 mixed, 0 central), 45 hypopneas and 12 RERAs. The apnea/hypopnea index 15.2 was events/hour and the RDI was 19.2 events/hour. The central sleep apnea index was 0.0 events/hour. The REM AHI was 34.9 /h and NREM AHI was 2.7/h. The REM RDI was 44.3 /h and NREM RDI was 3.2 /h. The supine AHI was 12.9/h, and the non supine AHI was 17.7/h; supine during 51.4% of sleep. The supine RDI was 17.4/h, and the non supine RDI was 21.10/h. Respiratory disturbances were associated with oxygen desaturation down to a nadir of 77.0 % during sleep. The mean oxygen saturation during the study was 89.5%. The cumulative time under 88% oxygen saturation was 23.2 minutes.  During the titration portion, the apnea/hypopnea index (AHI) was 1.6 events/hour and the RDI was 2.4 events/hour. The central sleep apnea index was events/hour. The most appropriate setting of CPAP was IPAP/EPAP 9/9 cm H2O. At this setting, the sleep efficiency was 94% and the patient was supine for 100%. The AHI was 0  events per hour(with 0 central events). Oxygen nadir was 88.0. LEG MOVEMENT DATA The periodic limb movement index was 0.0/hour with an associated arousal index  of /hour. CARDIAC DATA The underlying cardiac rhythm was most consistent with sinus rhythm. Mean heart rate was 66.3 during diagnostic portion and 65.8 during titration portion of study. Additional rhythm abnormalities include None.  IMPRESSIONS - Moderate Obstructive Sleep apnea(OSA) Optimal pressure attained. - EKG showed no cardiac abnormalities. - Severe Oxygen Desaturation - No snoring was audible during this study. - EEG did not show alpha intrusion. - No significant periodic leg movements(PLMs) during sleep. However, no significant associated arousals. - Normal sleep efficiency, normal primary sleep latency, short REM sleep latency and normal slow wave latency.  DIAGNOSIS - Moderate Obstructive Sleep Apnea (G47.33)  RECOMMENDATIONS - Trial of CPAP therapy on 9 cm H2O, C-Flex of 3 with a Medium size Resmed Nasal Airfit N20 mask and heated humidification. - Avoid alcohol, sedatives and other CNS depressants that may worsen sleep apnea and disrupt normal sleep architecture. - Sleep hygiene should be reviewed to assess factors that may improve sleep quality. - Weight management and regular exercise should be initiated or continued. - Return to Sleep Center for re-evaluation after 4 weeks of therapy  [Electronically signed] 07/16/2022 03:24 PM  Sherrilyn Rist MD NPI: [id number]

## 2022-07-28 NOTE — Telephone Encounter (Signed)
ATC patient. LVMTCB. 

## 2022-07-31 NOTE — Telephone Encounter (Signed)
Called and spoke with patient. Patient verbalized understanding. Advised patient I would place the order for the cpap trial. Patient stated he has a nasal mask and would like to get a full face mask as well because right now he is stopped up and can't really breathe through his nose. Order has been placed.   Nothing further needed.

## 2022-07-31 NOTE — Telephone Encounter (Signed)
Pt. Called back to get sleep results

## 2022-10-11 ENCOUNTER — Encounter: Payer: Self-pay | Admitting: General Practice

## 2022-10-12 ENCOUNTER — Encounter: Payer: 59 | Admitting: Family Medicine

## 2022-10-20 ENCOUNTER — Telehealth: Payer: Self-pay | Admitting: Pulmonary Disease

## 2022-10-20 NOTE — Telephone Encounter (Signed)
Needs Cert of Medical [facility] sent. Last one sent was not readable.  Please be sure the DOB is correct. One sent had the wrong month.   [employer] in [place] [phone]  Attn: kim E.

## 2022-10-25 NOTE — Telephone Encounter (Signed)
Called Kevin [name] at [employer] in [place] regarding the request for the Certificate Of Medical Necessity For CPAP/BIPAP Therapy. Kim faxed a revised copy to [place].  Will give to Dr. Wynona Neat to sign and will refax back to Selena Batten at number on their fax cover sheet per Selena Batten E.  Nothing further needed.  Note:  Form signed by Dr. Wynona Neat and faxed to Kevin [name] at [facility].

## 2022-10-30 ENCOUNTER — Telehealth: Payer: Self-pay | Admitting: Pulmonary Disease

## 2022-10-30 NOTE — Telephone Encounter (Signed)
Kevin [name] calling from [employer] asking for face to face notes and attestation    Her fax # is [phone]  Call Back [phone]  Request was sent (3 requests)  to no avail on  4/11 4/18 & 6/5

## 2022-11-01 NOTE — Telephone Encounter (Signed)
Faxed office notes to [employer]. Nothing further needed.
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
## 2023-06-04 ENCOUNTER — Telehealth: Payer: Self-pay

## 2023-06-04 NOTE — Telephone Encounter (Signed)
 We received a fax for patient and I noticed that he has not established care with another [facility] provider since Dr. French Ana McLean-Scocuzza left.  I spoke with patient and he states he has established primary care elsewhere.

## 2023-10-17 ENCOUNTER — Ambulatory Visit: Admitting: Family Medicine

## 2023-10-24 ENCOUNTER — Other Ambulatory Visit: Payer: Self-pay

## 2023-10-24 ENCOUNTER — Ambulatory Visit: Admitting: Family Medicine

## 2023-10-24 ENCOUNTER — Encounter: Payer: Self-pay | Admitting: Family Medicine

## 2023-10-24 VITALS — BP 136/86 | HR 70 | Ht 70.0 in | Wt 224.0 lb

## 2023-10-24 DIAGNOSIS — M25531 Pain in right wrist: Secondary | ICD-10-CM

## 2023-10-24 NOTE — Patient Instructions (Addendum)
 Thank you for coming in today.   Please work on the home exercises the athletic trainer went over with you:  View at [email or web address] code [email or web address]  Let me know if you would like a referral to occupational therapy  Check back as needed

## 2023-10-24 NOTE — Progress Notes (Signed)
   I, Miquel Amen, CMA acting as a scribe for Garlan Juniper, MD.  Kevin [name] is a 48 y.o. male who presents to [facility] today for forearm pain. Pt was previously seen by Dr. Alease Hunter in 2023 for L shoulder pain.  Today, pt c/o R forearm pain x 1.5 weeks, started after doing pull ups, felt a pop. Pt locates pain to volar forearm.. Burning sensation. Pushing OK, unable to pull. Iced the area following injury. No pain at rest.  These injuries occurred while doing kipp type pull-ups with [employer].  Radiates: localized Paresthesia: slightly Grip strength: normal Aggravates: lifting, pulling - biceps contraction Treatments tried: ice  Pertinent review of systems: No fevers or chills  Relevant historical information: Mnire's disease and postconcussion syndrome history. Patient participates in weightlifting specifically [employer].  Exam:  BP 136/86   Pulse 70   Ht 5\' 10"  (1.778 m)   Wt 224 lb (101.6 kg)   SpO2 98%   BMI 32.14 kg/m  General: Well Developed, well nourished, and in no acute distress.   MSK: Right forearm normal-appearing no bruising.  Normal elbow and wrist motion.  Not particularly tender to palpation.  Intact strength.  Unable to reproduce pain with resisted extension flexion pronation supination. Normal hook test anterior elbow.   Lab and Radiology Results  [facility] Ultrasound of: Right forearm volar area of pain Normal appearing musculature right forearm.  At the muscle tendinous junction at the area of pain there is some blunting of the typical pinnate fibers which could indicate a small strain or tear.  No area of hypoechoic fluid collection visible.  No visible tendon tear. Impression: Right forearm strain     Assessment and Plan: 48 y.o. male with right forearm strain occurring while doing [employer] pull-ups.  Plan for home exercise program.  If not improving consider occupational therapy.  Advance  activity as tolerated.  Reviewed treatment plan and options.   PDMP not reviewed this encounter. Orders Placed This Encounter  Procedures   US  LIMITED JOINT SPACE STRUCTURES UP RIGHT(NO LINKED CHARGES)    Reason for Exam (SYMPTOM  OR DIAGNOSIS REQUIRED):   forearm pain    Preferred imaging location?:   [facility]   No orders of the defined types were placed in this encounter.    Discussed warning signs or symptoms. Please see discharge instructions. Patient expresses understanding.   The above documentation has been reviewed and is accurate and complete Garlan Juniper, M.D.

## 2023-11-15 ENCOUNTER — Other Ambulatory Visit: Payer: Self-pay | Admitting: Family Medicine

## 2023-11-15 DIAGNOSIS — Z9189 Other specified personal risk factors, not elsewhere classified: Secondary | ICD-10-CM

## 2023-11-15 DIAGNOSIS — Z0000 Encounter for general adult medical examination without abnormal findings: Secondary | ICD-10-CM

## 2023-11-28 ENCOUNTER — Ambulatory Visit
Admission: RE | Admit: 2023-11-28 | Discharge: 2023-11-28 | Disposition: A | Discharge status: Home / Self Care | Payer: Self-pay | Source: Ambulatory Visit | Attending: Family Medicine | Admitting: Family Medicine

## 2023-11-28 DIAGNOSIS — Z9189 Other specified personal risk factors, not elsewhere classified: Secondary | ICD-10-CM | POA: Insufficient documentation

## 2023-11-28 DIAGNOSIS — Z0000 Encounter for general adult medical examination without abnormal findings: Secondary | ICD-10-CM | POA: Insufficient documentation

## 2024-01-22 ENCOUNTER — Encounter: Payer: Self-pay | Admitting: Sports Medicine

## 2024-06-26 ENCOUNTER — Telehealth: Payer: Self-pay

## 2024-06-26 NOTE — Telephone Encounter (Signed)
 Pt needs appt no visit since 2023 called and spoke with pt appt made   Copied from CRM [id number]. Topic: Clinical - Order For Equipment >> Jun 25, 2024  4:42 PM Dedra B wrote: Reason for CRM: Patient needs a new CPAP prescription sent to [facility].

## 2024-07-02 ENCOUNTER — Ambulatory Visit (HOSPITAL_BASED_OUTPATIENT_CLINIC_OR_DEPARTMENT_OTHER)
